# Patient Record
Sex: Male | Born: 1985 | Hispanic: No | Marital: Single | Smoking: Never smoker
Health system: Southern US, Community
[De-identification: ages and names within clinical notes are randomized; demographics above are authoritative.]

## PROBLEM LIST (undated history)

## (undated) DIAGNOSIS — N179 Acute kidney failure, unspecified: Secondary | ICD-10-CM

## (undated) HISTORY — PX: LEG SURGERY: SHX1003

---

## 2015-03-23 ENCOUNTER — Encounter (HOSPITAL_COMMUNITY): Payer: Self-pay | Admitting: Emergency Medicine

## 2015-03-23 ENCOUNTER — Emergency Department (HOSPITAL_COMMUNITY)
Admission: EM | Admit: 2015-03-23 | Discharge: 2015-03-23 | Disposition: A | Discharge status: Home / Self Care | Payer: Self-pay | Attending: Emergency Medicine | Admitting: Emergency Medicine

## 2015-03-23 DIAGNOSIS — Z87448 Personal history of other diseases of urinary system: Secondary | ICD-10-CM | POA: Insufficient documentation

## 2015-03-23 DIAGNOSIS — M5416 Radiculopathy, lumbar region: Secondary | ICD-10-CM | POA: Insufficient documentation

## 2015-03-23 HISTORY — DX: Acute kidney failure, unspecified: N17.9

## 2015-03-23 MED ORDER — METHOCARBAMOL 500 MG PO TABS
500.0000 mg | ORAL_TABLET | Freq: Once | ORAL | Status: AC
  Administered 2015-03-23: 500 mg via ORAL
  Filled 2015-03-23: qty 1

## 2015-03-23 MED ORDER — METHOCARBAMOL 500 MG PO TABS: 500.0000 mg | ORAL_TABLET | Freq: Three times a day (TID) | ORAL | Status: DC

## 2015-03-23 MED ORDER — KETOROLAC TROMETHAMINE 10 MG PO TABS
10.0000 mg | ORAL_TABLET | Freq: Once | ORAL | Status: AC
  Administered 2015-03-23: 10 mg via ORAL
  Filled 2015-03-23: qty 1

## 2015-03-23 MED ORDER — DICLOFENAC SODIUM 75 MG PO TBEC: 75.0000 mg | DELAYED_RELEASE_TABLET | Freq: Two times a day (BID) | ORAL | Status: DC

## 2015-03-23 NOTE — ED Provider Notes (Signed)
CSN: [id number]     Arrival date & time 03/23/15  1412 History  This chart was scribed for non-physician practitioner, Pauline Aus, PA-C, working with Vanetta Mulders, MD, by Budd Palmer ED Scribe. This patient was seen in room APFT23/APFT23 and the patient's care was started at 2:40 PM  Chief Complaint  Patient presents with  . Back Injury   Patient is a 29 y.o. male presenting with back pain. The history is provided by the patient. No language interpreter was used.  Back Pain Location:  Lumbar spine and sacro-iliac joint Quality:  Aching Radiates to:  R posterior upper leg, R thigh and R foot Pain severity:  Moderate Onset quality:  Gradual Duration:  6 days Timing:  Constant Progression:  Worsening Chronicity:  New Context: lifting heavy objects   Worsened by:  Sitting Ineffective treatments:  Ibuprofen Associated symptoms: leg pain   Associated symptoms: no abdominal pain, no bladder incontinence, no bowel incontinence, no chest pain, no dysuria, no fever, no numbness and no weakness    HPI Comments: Troy Edwards is a 29 y.o. male who presents to the Emergency Department complaining of gradually worsening right lower back pain onset 6 days ago. Patient states he worked out and performing squats 7 days ago and did not have onset of pain until the next day. He reports associated radiation of pain to right leg down to the sole of his right foot.  He reports increased pain when sitting, resolution of painf when standing or lying down supine. He states prior history of a similar injury, but says it was only an aggravation, without neurological damage. He denies associated abdominal pain, hip pain, and pain in the left leg. He has been taking ibuprofen since onset with moderate relief. He reports pain has worsened today, and his mother gave him a stronger pain pill of unknown medication, which helped to relieve the pain PTA. 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 Patient is not currently seen by a PCP.  Past Medical History  Diagnosis Date  . Acute renal failure    Past Surgical History  Procedure Laterality Date  . Leg surgery     History reviewed. No pertinent family history. History  Substance Use Topics  . Smoking status: Never Smoker   . Smokeless tobacco: Never Used  . Alcohol Use: Yes     Comment: occas   Review of Systems  Constitutional: Negative for fever, chills and fatigue.  Respiratory: Negative for cough and shortness of breath.   Cardiovascular: Negative for chest pain and palpitations.  Gastrointestinal: Negative for nausea, vomiting, abdominal pain and bowel incontinence.  Genitourinary: Negative for bladder incontinence, dysuria, hematuria and flank pain.  Musculoskeletal: Positive for back pain. Negative for arthralgias, gait problem, neck pain and neck stiffness.  Skin: Negative for rash.  Neurological: Negative for dizziness, weakness and numbness.  Hematological: Does not bruise/bleed easily.   Allergies  Review of patient's allergies indicates no known allergies.  Home Medications   Prior to Admission medications   Not on File   Triage Vitals: BP 138/67 mmHg  Pulse 109  Temp(Src) 98 F (36.7 C) (Oral)  Resp 18  Ht 5\' 7"  (1.702 m)  Wt 179 lb (81.194 kg)  BMI 28.03 kg/m2  SpO2 100%  Physical Exam  Constitutional: He is oriented to person, place, and time. He appears well-developed and well-nourished. No distress.  HENT:  Head: Normocephalic and atraumatic.  Eyes: Conjunctivae are normal.  Cardiovascular: Normal rate,  regular rhythm, normal heart sounds and intact distal pulses.   No murmur heard. Pulmonary/Chest: Effort normal and breath sounds normal. No respiratory distress. He has no wheezes.  Abdominal: Soft. Bowel sounds are normal. He exhibits no distension. There is no tenderness.  Musculoskeletal: Normal range of motion. He exhibits tenderness. He exhibits no edema.       Lumbar back:  He exhibits tenderness and pain. He exhibits normal range of motion, no swelling, no deformity, no laceration and normal pulse.  ttp of the right lumbar paraspinal muscles.  No spinal tenderness.  DP pulses are brisk and symmetrical.  Distal sensation intact.  Hip Flexors/Extensors are intact.  Pt has 5/5 strength against resistance of bilateral lower extremities.     Neurological: He is alert and oriented to person, place, and time. He has normal strength. No sensory deficit. He exhibits normal muscle tone. Coordination and gait normal.  Reflex Scores:      Patellar reflexes are 2+ on the right side and 2+ on the left side.      Achilles reflexes are 2+ on the right side and 2+ on the left side. Skin: Skin is warm and dry. No rash noted.  Psychiatric: He has a normal mood and affect.  Nursing note and vitals reviewed.  ED Course  Procedures (including critical care time) DIAGNOSTIC STUDIES: Oxygen Saturation is 100% on RA, normal by my interpretation.    COORDINATION OF CARE: 2:47 PM- Discussed plans to treat with muscle relaxant, and anti-inflammatory meds. Reccomended applying ice packs now, and heat after a few days. Will refer pt to see PCP. Pt advised of plan for treatment and pt agrees.  Labs Review Labs Reviewed - No data to display  Imaging Review No results found.   EKG Interpretation None      MDM   Final diagnoses:  Right lumbar radiculopathy   Pt is well appearing.  No focal neuro deficits, no concerning sx's for emergent neuro or infectious process.  Likely lumbar strain.  Pt agrees to sx tx and close f/u if needed.  Ambulates from the dept with a steady gait.  Rx for diclofenac and robaxin.     I personally performed the services described in this documentation, which was scribed in my presence. The recorded information has been reviewed and is accurate.   Pauline Aus, PA-C 03/24/15 1727  Vanetta Mulders, MD 03/26/15 2006

## 2015-03-23 NOTE — ED Notes (Signed)
Pt alert & oriented x4, stable gait. Patient given discharge instructions, paperwork & prescription(s). Patient  instructed to stop at the registration desk to finish any additional paperwork. Patient verbalized understanding. Pt left department w/ no further questions. 

## 2015-03-23 NOTE — ED Notes (Signed)
Pt reports numbness and tingling in his R leg and both hands. Onset of symptoms on Monday. Pt reports radiation of symptoms into his leg from his back. Pt denies problems with bowel or bladder.

## 2015-03-23 NOTE — Discharge Instructions (Signed)

## 2015-12-08 ENCOUNTER — Emergency Department (HOSPITAL_COMMUNITY)
Admission: EM | Admit: 2015-12-08 | Discharge: 2015-12-08 | Disposition: A | Discharge status: Home / Self Care | Payer: Self-pay | Attending: Emergency Medicine | Admitting: Emergency Medicine

## 2015-12-08 ENCOUNTER — Encounter (HOSPITAL_COMMUNITY): Payer: Self-pay | Admitting: *Deleted

## 2015-12-08 DIAGNOSIS — Z79899 Other long term (current) drug therapy: Secondary | ICD-10-CM | POA: Insufficient documentation

## 2015-12-08 DIAGNOSIS — Z791 Long term (current) use of non-steroidal anti-inflammatories (NSAID): Secondary | ICD-10-CM | POA: Insufficient documentation

## 2015-12-08 DIAGNOSIS — R21 Rash and other nonspecific skin eruption: Secondary | ICD-10-CM | POA: Insufficient documentation

## 2015-12-08 DIAGNOSIS — L739 Follicular disorder, unspecified: Secondary | ICD-10-CM | POA: Insufficient documentation

## 2015-12-08 LAB — URINE MICROSCOPIC-ADD ON: Squamous Epithelial / LPF: NONE SEEN

## 2015-12-08 LAB — URINALYSIS, ROUTINE W REFLEX MICROSCOPIC
Bilirubin Urine: NEGATIVE
Glucose, UA: NEGATIVE mg/dL
Ketones, ur: NEGATIVE mg/dL
Leukocytes, UA: NEGATIVE
Nitrite: NEGATIVE
PH: 5.5 (ref 5.0–8.0)
Protein, ur: 100 mg/dL — AB
SPECIFIC GRAVITY, URINE: 1.01 (ref 1.005–1.030)

## 2015-12-08 MED ORDER — DOXYCYCLINE HYCLATE 100 MG PO TABS
100.0000 mg | ORAL_TABLET | Freq: Once | ORAL | Status: AC
  Administered 2015-12-08: 100 mg via ORAL
  Filled 2015-12-08: qty 1

## 2015-12-08 MED ORDER — DOXYCYCLINE HYCLATE 100 MG PO CAPS: 100.0000 mg | ORAL_CAPSULE | Freq: Two times a day (BID) | ORAL | Status: DC

## 2015-12-08 NOTE — ED Provider Notes (Signed)
CSN: [id number]     Arrival date & time 12/08/15  1853 History   First MD Initiated Contact with Patient 12/08/15 2054     No chief complaint on file.    (Consider location/radiation/quality/duration/timing/severity/associated sxs/prior Treatment) HPI Comments: Patient is a 30 year old male who presents to the emergency department for a sexual transmitted disease check.  The patient states that he noticed some bumps and blisters in the pubis area. He says he popped several of the blisters, now he has areas with scabs, and some areas would blisters. He states that one of his friends who is in nursing training looked at the area and thought that he may have herpes. The patient states he has not knowingly been with anyone who had a sexually transmitted disease. He has not had any drainage from the penis. He's not had any fever or chills. He states there is also a rash on his buttocks area. He is not taken anything for these. They have not been evaluated before tonight.  The history is provided by the patient.    Past Medical History  Diagnosis Date  . Acute renal failure [facility](HCC)    Past Surgical History  Procedure Laterality Date  . Leg surgery     History reviewed. No pertinent family history. Social History  Substance Use Topics  . Smoking status: Never Smoker   . Smokeless tobacco: Never Used  . Alcohol Use: Yes     Comment: occas    Review of Systems  Constitutional: Negative for fever and chills.  Genitourinary: Negative for dysuria, urgency, discharge, penile swelling, scrotal swelling, penile pain and testicular pain.  Skin: Positive for rash.  All other systems reviewed and are negative.     Allergies  Review of patient's allergies indicates no known allergies.  Home Medications   Prior to Admission medications   Medication Sig Start Date End Date Taking? Authorizing Provider  diclofenac (VOLTAREN) 75 MG EC tablet Take 1 tablet (75 mg total) by mouth 2 (two) times  daily. Take with food 03/23/15   Tammy Triplett, PA-C  methocarbamol (ROBAXIN) 500 MG tablet Take 1 tablet (500 mg total) by mouth 3 (three) times daily. 03/23/15   Tammy Triplett, PA-C   BP 147/81 mmHg  Pulse 90  Temp(Src) 98.3 F (36.8 C) (Temporal)  Resp 18  Ht 5' 7.5" (1.715 m)  Wt 81.647 kg  BMI 27.76 kg/m2  SpO2 100% Physical Exam  Constitutional: He is oriented to person, place, and time. He appears well-developed and well-nourished.  Non-toxic appearance.  HENT:  Head: Normocephalic.  Right Ear: Tympanic membrane and external ear normal.  Left Ear: Tympanic membrane and external ear normal.  Eyes: EOM and lids are normal. Pupils are equal, round, and reactive to light.  Neck: Normal range of motion. Neck supple. Carotid bruit is not present.  Cardiovascular: Normal rate, regular rhythm, normal heart sounds, intact distal pulses and normal pulses.   Pulmonary/Chest: Breath sounds normal. No respiratory distress.  Abdominal: Soft. Bowel sounds are normal. There is no tenderness. There is no guarding.  Genitourinary:  The patient has been shaving the pubis. There are multiple scabbed areas at the base of the penis extending into the pubis area. There no lesions on the scrotum. There no lesions on the penis. There is no penile discharge noted. There is no tenderness of the testicles. There are palpable nodes in the inguinal area. There are a few blisters in between the scabbed areas.  There is a area of increased  redness with very small blisters noted on the right mid buttocks cheek. The anus is spared.  Musculoskeletal: Normal range of motion.  Lymphadenopathy:       Head (right side): No submandibular adenopathy present.       Head (left side): No submandibular adenopathy present.    He has no cervical adenopathy.  Neurological: He is alert and oriented to person, place, and time. He has normal strength. No cranial nerve deficit or sensory deficit.  Skin: Skin is warm and dry.   Psychiatric: He has a normal mood and affect. His speech is normal.  Nursing note and vitals reviewed.   ED Course  Procedures (including critical care time) Labs Review Labs Reviewed - No data to display  Imaging Review No results found. I have personally reviewed and evaluated these images and lab results as part of my medical decision-making.   EKG Interpretation None      MDM  The the scabbed areas on the pubis as well as the blister areas could possibly be related to a folliculitis. A herpes simplex 2 test has been sent to the lab. GC chlamydia testing has also been sent to the lab. The patient will be treated with doxycycline and triple antibiotic ointment. I discussed these findings, and the possibilities of diagnosis with the patient in terms which he understands and he is in agreement with this discharge plan.    Final diagnoses:  None    *I have reviewed nursing notes, vital signs, and all appropriate lab and imaging results for this patient.**    Ivery Quale, PA-C 12/08/15 2159  Dione Booze, MD 12/08/15 [phone]

## 2015-12-08 NOTE — Discharge Instructions (Signed)
A herpes test, as well as a gonorrhea Chlamydia tests have been sent to the lab. There is a good chance that these lesions may be what is called folliculitis, related to shaving in this area. Please use doxycycline 2 times daily, and applied triple antibiotic ointment to the area on the pubis, as well as the area on the buttocks until you're tests return. Please refrain from sexual activity until your test return. Folliculitis Folliculitis is redness, soreness, and swelling (inflammation) of the hair follicles. This condition can occur anywhere on the body. People with weakened immune systems, diabetes, or obesity have a greater risk of getting folliculitis. CAUSES  Bacterial infection. This is the most common cause.  Fungal infection.  Viral infection.  Contact with certain chemicals, especially oils and tars. Long-term folliculitis can result from bacteria that live in the nostrils. The bacteria may trigger multiple outbreaks of folliculitis over time. SYMPTOMS Folliculitis most commonly occurs on the scalp, thighs, legs, back, buttocks, and areas where hair is shaved frequently. An early sign of folliculitis is a small, white or yellow, pus-filled, itchy lesion (pustule). These lesions appear on a red, inflamed follicle. They are usually less than 0.2 inches (5 mm) wide. When there is an infection of the follicle that goes deeper, it becomes a boil or furuncle. A group of closely packed boils creates a larger lesion (carbuncle). Carbuncles tend to occur in hairy, sweaty areas of the body. DIAGNOSIS  Your caregiver can usually tell what is wrong by doing a physical exam. A sample may be taken from one of the lesions and tested in a lab. This can help determine what is causing your folliculitis. TREATMENT  Treatment may include:  Applying warm compresses to the affected areas.  Taking antibiotic medicines orally or applying them to the skin.  Draining the lesions if they contain a large  amount of pus or fluid.  Laser hair removal for cases of long-lasting folliculitis. This helps to prevent regrowth of the hair. HOME CARE INSTRUCTIONS  Apply warm compresses to the affected areas as directed by your caregiver.  If antibiotics are prescribed, take them as directed. Finish them even if you start to feel better.  You may take over-the-counter medicines to relieve itching.  Do not shave irritated skin.  Follow up with your caregiver as directed. SEEK IMMEDIATE MEDICAL CARE IF:   You have increasing redness, swelling, or pain in the affected area.  You have a fever. MAKE SURE YOU:  Understand these instructions.  Will watch your condition.  Will get help right away if you are not doing well or get worse.   This information is not intended to replace advice given to you by your health care provider. Make sure you discuss any questions you have with your health care provider.   Document Released: 11/29/2001 Document Revised: 10/11/2014 Document Reviewed: 12/21/2011 Elsevier Interactive Patient Education [employer]2016 Elsevier Inc.

## 2015-12-08 NOTE — ED Notes (Addendum)
Pt requesting STD check.  Pt believes he may have herpes outbreak.

## 2015-12-09 ENCOUNTER — Telehealth (HOSPITAL_BASED_OUTPATIENT_CLINIC_OR_DEPARTMENT_OTHER): Payer: Self-pay | Admitting: Emergency Medicine

## 2015-12-10 ENCOUNTER — Telehealth (HOSPITAL_BASED_OUTPATIENT_CLINIC_OR_DEPARTMENT_OTHER): Payer: Self-pay | Admitting: Emergency Medicine

## 2015-12-10 LAB — GC/CHLAMYDIA PROBE AMP (~~LOC~~) NOT AT ARMC
CHLAMYDIA, DNA PROBE: NEGATIVE
Neisseria Gonorrhea: NEGATIVE

## 2015-12-10 LAB — HSV 2 ANTIBODY, IGG: HSV 2 GLYCOPROTEIN G AB, IGG: 9.25 {index} — AB (ref 0.00–0.90)

## 2015-12-11 ENCOUNTER — Telehealth (HOSPITAL_BASED_OUTPATIENT_CLINIC_OR_DEPARTMENT_OTHER): Payer: Self-pay | Admitting: Emergency Medicine

## 2015-12-11 NOTE — Telephone Encounter (Signed)
Post ED Visit - Positive Culture Follow-up: Successful Patient Follow-Up  Culture assessed and recommendations reviewed by: []  Enzo BiNathan Batchelder, Pharm.D. []  Celedonio MiyamotoJeremy Frens, Pharm.D., BCPS []  Garvin FilaMike Maccia, Pharm.D. []  Georgina PillionElizabeth Martin, Pharm.D., BCPS []  CreedmoorMinh Pham, [address] Rainbow BoulevardPharm.D., BCPS, AAHIVP []  Estella HuskMichelle Turner, Pharm.D., BCPS, AAHIVP []  Tennis Mustassie Stewart, Pharm.D. []  Sherle Poeob Vincent, [place]D.  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

## 2016-03-18 ENCOUNTER — Emergency Department (HOSPITAL_COMMUNITY)
Admission: EM | Admit: 2016-03-18 | Discharge: 2016-03-18 | Disposition: A | Discharge status: Home / Self Care | Payer: Self-pay | Attending: Emergency Medicine | Admitting: Emergency Medicine

## 2016-03-18 ENCOUNTER — Encounter (HOSPITAL_COMMUNITY): Payer: Self-pay | Admitting: *Deleted

## 2016-03-18 DIAGNOSIS — J029 Acute pharyngitis, unspecified: Secondary | ICD-10-CM | POA: Insufficient documentation

## 2016-03-18 LAB — RAPID STREP SCREEN (MED CTR MEBANE ONLY): STREPTOCOCCUS, GROUP A SCREEN (DIRECT): NEGATIVE

## 2016-03-18 NOTE — Discharge Instructions (Signed)
You may alternate between Tylenol 1000 mg every 6 hours as needed for fever and pain and ibuprofen 800 mg every 8 hours as needed for fever and pain. Both of these medications are found over-the-counter. I recommend you gargle with warm saltwater as this may help with your pain. You may also use over-the-counter Chloraseptic spray. Your strep test was negative. At this time you do not need antibiotics. This is likely caused by a virus.   Pharyngitis Pharyngitis is redness, pain, and swelling (inflammation) of your pharynx.  CAUSES  Pharyngitis is usually caused by infection. Most of the time, these infections are from viruses (viral) and are part of a cold. However, sometimes pharyngitis is caused by bacteria (bacterial). Pharyngitis can also be caused by allergies. Viral pharyngitis may be spread from person to person by coughing, sneezing, and personal items or utensils (cups, forks, spoons, toothbrushes). Bacterial pharyngitis may be spread from person to person by more intimate contact, such as kissing.  SIGNS AND SYMPTOMS  Symptoms of pharyngitis include:   Sore throat.   Tiredness (fatigue).   Low-grade fever.   Headache.  Joint pain and muscle aches.  Skin rashes.  Swollen lymph nodes.  Plaque-like film on throat or tonsils (often seen with bacterial pharyngitis). DIAGNOSIS  Your health care provider will ask you questions about your illness and your symptoms. Your medical history, along with a physical exam, is often all that is needed to diagnose pharyngitis. Sometimes, a rapid strep test is done. Other lab tests may also be done, depending on the suspected cause.  TREATMENT  Viral pharyngitis will usually get better in 3-4 days without the use of medicine. Bacterial pharyngitis is treated with medicines that kill germs (antibiotics).  HOME CARE INSTRUCTIONS   Drink enough water and fluids to keep your urine clear or pale yellow.   Only take over-the-counter or  prescription medicines as directed by your health care provider:   If you are prescribed antibiotics, make sure you finish them even if you start to feel better.   Do not take aspirin.   Get lots of rest.   Gargle with 8 oz of salt water ( tsp of salt per 1 qt of water) as often as every 1-2 hours to soothe your throat.   Throat lozenges (if you are not at risk for choking) or sprays may be used to soothe your throat. SEEK MEDICAL CARE IF:   You have large, tender lumps in your neck.  You have a rash.  You cough up green, yellow-brown, or bloody spit. SEEK IMMEDIATE MEDICAL CARE IF:   Your neck becomes stiff.  You drool or are unable to swallow liquids.  You vomit or are unable to keep medicines or liquids down.  You have severe pain that does not go away with the use of recommended medicines.  You have trouble breathing (not caused by a stuffy nose). MAKE SURE YOU:   Understand these instructions.  Will watch your condition.  Will get help right away if you are not doing well or get worse.   This information is not intended to replace advice given to you by your health care provider. Make sure you discuss any questions you have with your health care provider.   Document Released: 09/20/2005 Document Revised: 07/11/2013 Document Reviewed: 05/28/2013 Elsevier Interactive Patient Education [employer] Elsevier Inc.

## 2016-03-18 NOTE — ED Notes (Signed)
Pt c/o sore throat x 3 days; pt states he sees white spots on the back of his throat

## 2016-03-18 NOTE — ED Provider Notes (Signed)
TIME SEEN: 6:10 AM  CHIEF COMPLAINT: Sore throat  HPI: Pt is a 30 y.o. male with no significant past medical history who presents to the emergency department with 3 days of sore throat. Worse on the right side. No fever, cough, vomiting, diarrhea, rash. No sick contacts. He is able to swallow, speak without difficulty. Took ibuprofen prior to arrival.  ROS: See HPI Constitutional: no fever  Eyes: no drainage  ENT: no runny nose   Cardiovascular:  no chest pain  Resp: no SOB  GI: no vomiting GU: no dysuria Integumentary: no rash  Allergy: no hives  Musculoskeletal: no leg swelling  Neurological: no slurred speech ROS otherwise negative  PAST MEDICAL HISTORY/PAST SURGICAL HISTORY:  Past Medical History  Diagnosis Date  . Acute renal failure (HCC)     MEDICATIONS:  Prior to Admission medications   Medication Sig Start Date End Date Taking? Authorizing Provider  doxycycline (VIBRAMYCIN) 100 MG capsule Take 1 capsule (100 mg total) by mouth 2 (two) times daily. 12/08/15   Ivery Quale, PA-C    ALLERGIES:  No Known Allergies  SOCIAL HISTORY:  Social History  Substance Use Topics  . Smoking status: Never Smoker   . Smokeless tobacco: Never Used  . Alcohol Use: Yes     Comment: occas    FAMILY HISTORY: No family history on file.  EXAM: BP 146/88 mmHg  Pulse 85  Temp(Src) 97.8 F (36.6 C) (Oral)  Resp 16  Ht  (1.727 m)  Wt 179 lb (81.194 kg)  BMI 27.22 kg/m2  SpO2 100% CONSTITUTIONAL: Alert and oriented and responds appropriately to questions. Well-appearing; well-nourished HEAD: Normocephalic EYES: Conjunctivae clear, PERRL ENT: normal nose; no rhinorrhea; moist mucous membranes; patient does have posterior pharyngeal erythema without petechiae, very mild bilateral tonsillar hypertrophy with exudate seen minimally on both tonsils. No uvular deviation, no obvious unilateral swelling noted, no trismus or drooling, normal phonation, no stridor, no dental caries or  abscess noted, no Ludwig's angina, tongue sits flat in the bottom of the mouth NECK: Supple, no meningismus, no LAD  CARD: RRR; S1 and S2 appreciated; no murmurs, no clicks, no rubs, no gallops RESP: Normal chest excursion without splinting or tachypnea; breath sounds clear and equal bilaterally; no wheezes, no rhonchi, no rales, no hypoxia or respiratory distress, speaking full sentences ABD/GI: Normal bowel sounds; non-distended; soft, non-tender, no rebound, no guarding, no peritoneal signs BACK:  The back appears normal and is non-tender to palpation, there is no CVA tenderness EXT: Normal ROM in all joints; non-tender to palpation; no edema; normal capillary refill; no cyanosis, no calf tenderness or swelling    SKIN: Normal color for age and race; warm; no rash NEURO: Moves all extremities equally, sensation to light touch intact diffusely, cranial nerves II through XII intact PSYCH: The patient's mood and manner are appropriate. Grooming and personal hygiene are appropriate.  MEDICAL DECISION MAKING: Patient here with what appears to be viral pharyngitis. Strep test is negative. I recommend alternating Tylenol and Motrin, warm saltwater gargles. I do not feel he needs to be on antibiotics. No sign of peritonsillar abscess, deep space neck infection, meningitis. He is very well-appearing, nontoxic, afebrile. Normal phonation, swallowing his secretions without difficulty and full range of motion in his neck. I feel he is safe for discharge. Discussed return precautions. He verbalized understanding and is comfortable with this plan.  At this time, I do not feel there is any life-threatening condition present. I have reviewed and discussed all results (  EKG, imaging, lab, urine as appropriate), exam findings with patient. I have reviewed nursing notes and appropriate previous records.  I feel the patient is safe to be discharged home without further emergent workup. Discussed usual and customary  return precautions. Patient and family (if present) verbalize understanding and are comfortable with this plan.  Patient will follow-up with their primary care provider. If they do not have a primary care provider, information for follow-up has been provided to them. All questions have been answered.      Layla MawKristen N Ward, DO 03/18/16 [phone]

## 2016-03-19 LAB — CULTURE, GROUP A STREP (THRC)

## 2016-05-25 ENCOUNTER — Emergency Department (HOSPITAL_COMMUNITY)
Admission: EM | Admit: 2016-05-25 | Discharge: 2016-05-25 | Disposition: A | Discharge status: Home / Self Care | Payer: Self-pay | Attending: Emergency Medicine | Admitting: Emergency Medicine

## 2016-05-25 ENCOUNTER — Emergency Department (HOSPITAL_COMMUNITY): Payer: Self-pay

## 2016-05-25 ENCOUNTER — Encounter (HOSPITAL_COMMUNITY): Payer: Self-pay

## 2016-05-25 DIAGNOSIS — R52 Pain, unspecified: Secondary | ICD-10-CM

## 2016-05-25 DIAGNOSIS — Z79899 Other long term (current) drug therapy: Secondary | ICD-10-CM | POA: Insufficient documentation

## 2016-05-25 DIAGNOSIS — N451 Epididymitis: Secondary | ICD-10-CM | POA: Insufficient documentation

## 2016-05-25 DIAGNOSIS — N4400 Torsion of testis, unspecified: Secondary | ICD-10-CM | POA: Insufficient documentation

## 2016-05-25 LAB — URINALYSIS, ROUTINE W REFLEX MICROSCOPIC
BILIRUBIN URINE: NEGATIVE
Glucose, UA: NEGATIVE mg/dL
Ketones, ur: NEGATIVE mg/dL
LEUKOCYTES UA: NEGATIVE
NITRITE: NEGATIVE
Protein, ur: >300 mg/dL — AB
SPECIFIC GRAVITY, URINE: 1.02 (ref 1.005–1.030)
pH: 7 (ref 5.0–8.0)

## 2016-05-25 LAB — URINE MICROSCOPIC-ADD ON

## 2016-05-25 MED ORDER — DOXYCYCLINE HYCLATE 100 MG PO TABS
100.0000 mg | ORAL_TABLET | Freq: Once | ORAL | Status: AC
  Administered 2016-05-25: 100 mg via ORAL
  Filled 2016-05-25: qty 1

## 2016-05-25 MED ORDER — LIDOCAINE HCL (PF) 1 % IJ SOLN
INTRAMUSCULAR | Status: AC
  Administered 2016-05-25: 5 mL
  Filled 2016-05-25: qty 5

## 2016-05-25 MED ORDER — CEFTRIAXONE SODIUM 250 MG IJ SOLR
250.0000 mg | Freq: Once | INTRAMUSCULAR | Status: AC
  Administered 2016-05-25: 250 mg via INTRAMUSCULAR
  Filled 2016-05-25: qty 250

## 2016-05-25 MED ORDER — DOXYCYCLINE HYCLATE 100 MG PO CAPS: 100.0000 mg | ORAL_CAPSULE | Freq: Two times a day (BID) | ORAL | 0 refills | Status: AC

## 2016-05-25 NOTE — ED Triage Notes (Signed)
Having testicular pain that started yesterday.  Right testicle feels like there is a lump and it is swollen.  Hurting up into lower pelvic area.  I have had testicular tension before, but it hurt a lot worse.  No discoloration noted per pt.  Uncle had testicular cancer before and that worries me.

## 2016-05-25 NOTE — ED Provider Notes (Signed)
Emergency Department Provider Note   I have reviewed the triage vital signs and the nursing notes.   HISTORY  Chief Complaint Testicle Pain   HPI Troy Edwards is a 30 y.o. 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 Patient states that he noticed some swelling 2 days ago and developed persistent pain yesterday. Pain is continued throughout the day today and so he presented to the emergency department. He has had a history of testicular torsion and states it was manually corrected and he was discharged home. Patient states he was not seen by urologist and had no additional evaluation or procedure after the torsion.   The patient reports some intermittent dysuria that is chronic in nature. He is currently having an HSV outbreak states it is resolving somewhat he does note some painful lymph nodes in his groin. No urethral discharge. No fevers. No trauma to the area. No lower abdominal pain.    Past Medical History:  Diagnosis Date  . Acute renal failure (HCC)     There are no active problems to display for this patient.   Past Surgical History:  Procedure Laterality Date  . LEG SURGERY      Current Outpatient Rx  . Order #: [id number] Class: Historical Med  . Order #: [id number] Class: Print    Allergies Review of patient's allergies indicates no known allergies.  No family history on file.  Social History Social History  Substance Use Topics  . Smoking status: Never Smoker  . Smokeless tobacco: Never Used  . Alcohol use Yes     Comment: occas    Review of Systems  Constitutional: No fever/chills Eyes: No visual changes. ENT: No sore throat. Cardiovascular: Denies chest pain. Respiratory: Denies shortness of breath. Gastrointestinal: No abdominal pain.  No nausea, no vomiting.  No diarrhea.  No constipation. Genitourinary: Chronic  dysuria and right testicle pain and swelling.  Musculoskeletal: Negative for back pain. Skin: Negative for rash. Neurological: Negative for headaches, focal weakness or numbness.  10-point ROS otherwise negative.  ____________________________________________   PHYSICAL EXAM:  VITAL SIGNS: ED Triage Vitals  Enc Vitals Group     BP 05/25/16 1903 145/95     Pulse Rate 05/25/16 1903 101     Resp 05/25/16 1903 18     Temp 05/25/16 1903 98.1 F (36.7 C)     Temp Source 05/25/16 1903 Oral     SpO2 05/25/16 1903 99 %     Weight 05/25/16 1904 178 lb (80.7 kg)     Height 05/25/16 1904 5\' 8"  (1.727 m)     Pain Score 05/25/16 1904 5   Constitutional: Alert and oriented. Well appearing and in no acute distress. Eyes: Conjunctivae are normal.  Head: Atraumatic. Nose: No congestion/rhinnorhea. Mouth/Throat: Mucous membranes are moist.  Oropharynx non-erythematous. Neck: No stridor.   Cardiovascular: Normal rate, regular rhythm. Good peripheral circulation. Grossly normal heart sounds.   Respiratory: Normal respiratory effort.  No retractions. Lungs CTAB. Gastrointestinal: Soft and nontender. No distention.  Genitourinary: Right testicle tenderness to palpation posteriorly with lower pole mass.  Musculoskeletal: No lower extremity tenderness nor edema. No gross deformities of extremities. Neurologic:  Normal speech and language. No gross focal neurologic deficits are appreciated.  Skin:  Skin is warm, dry and intact. No rash noted. Psychiatric: Mood and affect are normal. Speech and behavior are normal.  ____________________________________________   LABS (  all labs ordered are listed, but only abnormal results are displayed)  Labs Reviewed  URINALYSIS, ROUTINE W REFLEX MICROSCOPIC (NOT AT [facility]) - Abnormal; Notable for the following:       Result Value   Hgb urine dipstick MODERATE (*)    Protein, ur >300 (*)    All other components within normal limits  URINE MICROSCOPIC-ADD ON -  Abnormal; Notable for the following:    Squamous Epithelial / LPF 0-5 (*)    Bacteria, UA FEW (*)    All other components within normal limits   ____________________________________________  RADIOLOGY  [place] Scrotum  Result Date: 05/25/2016 CLINICAL DATA:  Right testicular pain and swelling for 1 day. EXAM: SCROTAL ULTRASOUND DOPPLER ULTRASOUND OF THE TESTICLES TECHNIQUE: Complete ultrasound examination of the testicles, epididymis, and other scrotal structures was performed. Color and spectral Doppler ultrasound were also utilized to evaluate blood flow to the testicles. COMPARISON:  None. FINDINGS: Right testicle Measurements: 4.5 x 2.2 x 2.7 cm. No mass or microlithiasis visualized. Normal blood flow. Left testicle Measurements: 4.3 x 2.3 x 2.9 cm. No mass or microlithiasis visualized. Normal blood flow. Right epididymis: Prominent size and heterogeneous. No definite increased blood flow. Left epididymis:  Normal in size and appearance. Hydrocele:  Small left hydrocele.  No right hydrocele. Varicocele:  None visualized. Pulsed Doppler interrogation of both testes demonstrates normal low resistance arterial and venous waveforms bilaterally. Other:  Normal size and morphology lymph nodes in the right groin. IMPRESSION: 1. Mild enlargement and heterogeneity of the right epididymis may reflect epididymitis. 2. No evidence of concurrent orchitis. Normal blood flow to both testicles without torsion. Electronically Signed   By: Rubye OaksMelanie  Ehinger M.D.   On: 05/25/2016 21:16   [place] Art/ven Flow Abd Pelv Doppler  Result Date: 05/25/2016 CLINICAL DATA:  Right testicular pain and swelling for 1 day. EXAM: SCROTAL ULTRASOUND DOPPLER ULTRASOUND OF THE TESTICLES TECHNIQUE: Complete ultrasound examination of the testicles, epididymis, and other scrotal structures was performed. Color and spectral Doppler ultrasound were also utilized to evaluate blood flow to the testicles. COMPARISON:  None. FINDINGS: Right testicle  Measurements: 4.5 x 2.2 x 2.7 cm. No mass or microlithiasis visualized. Normal blood flow. Left testicle Measurements: 4.3 x 2.3 x 2.9 cm. No mass or microlithiasis visualized. Normal blood flow. Right epididymis: Prominent size and heterogeneous. No definite increased blood flow. Left epididymis:  Normal in size and appearance. Hydrocele:  Small left hydrocele.  No right hydrocele. Varicocele:  None visualized. Pulsed Doppler interrogation of both testes demonstrates normal low resistance arterial and venous waveforms bilaterally. Other:  Normal size and morphology lymph nodes in the right groin. IMPRESSION: 1. Mild enlargement and heterogeneity of the right epididymis may reflect epididymitis. 2. No evidence of concurrent orchitis. Normal blood flow to both testicles without torsion. Electronically Signed   By: Rubye OaksMelanie  Ehinger M.D.   On: 05/25/2016 21:16    ____________________________________________   PROCEDURES  Procedure(s) performed:   Procedures  None ____________________________________________   INITIAL IMPRESSION / ASSESSMENT AND PLAN / ED COURSE  Pertinent labs & imaging results that were available during my care of the patient were reviewed by me and considered in my medical decision making (see chart for details).  Patient resents to the emergency department for evaluation of right testicular pain and area of swelling. The patient has a palpable mass the posterior pole of his right testicle that is tender to palpation. Unclear if this represents hydrocele, epididymitis, torsion, or torsion of testicle appendix. No scrotal discoloration.  No fever. Normal abdominal exam. Given that the patient was never surgically managed for his testicular torsion I still have increased level of suspicion for this diagnosis and feel he must be evaluated completely ruled out. Plan for urgent testicular ultrasound to assess flow. Have called an ultrasound technician for evaluation. Appreciate  assistance.   09:28 PM No evidence of torsion on US. Following UA.   UA resulted with few bacteria. Patient with prior protein in urine before. Plan for treatment of epididymitis. Discussed that with his age and history this is most commonly the result of an STI. He will contact any sexual partners to advise testing. Discussed return precautions in detail. He will follow up with his PCP and Urology as needed by referral from his PCP. Discussed warning signs of testicular torsion. Given that the patient never had surgery for his torsion he remains at risk.   At this time, I do not feel there is any life-threatening condition present. I have reviewed and discussed all results (EKG, imaging, lab, urine as appropriate), exam findings with patient. I have reviewed nursing notes and appropriate previous records.  I feel the patient is safe to be discharged home without further emergent workup. Discussed usual and customary return precautions. Patient and family (if present) verbalize understanding and are comfortable with this plan.  Patient will follow-up with their primary care provider. If they do not have a primary care provider, information for follow-up has been provided to them. All questions have been answered.  ____________________________________________  FINAL CLINICAL IMPRESSION(S) / ED DIAGNOSES  Final diagnoses:  Torsion of testicle  Epididymitis    MEDICATIONS GIVEN DURING THIS VISIT:  Medications  cefTRIAXone (ROCEPHIN) injection 250 mg (250 mg Intramuscular Given 05/25/16 2331)  doxycycline (VIBRA-TABS) tablet 100 mg (100 mg Oral Given 05/25/16 2332)  lidocaine (PF) (XYLOCAINE) 1 % injection (5 mLs  Given 05/25/16 2331)    NEW OUTPATIENT MEDICATIONS STARTED DURING THIS VISIT:  New Prescriptions   DOXYCYCLINE (VIBRAMYCIN) 100 MG CAPSULE    Take 1 capsule (100 mg total) by mouth 2 (two) times daily.      Note:  This document was prepared using Dragon voice recognition software  and may include unintentional dictation errors.  Alona BeneJoshua Long, MD Emergency Medicine   Maia PlanJoshua G Long, MD 05/25/16 [phone]

## 2016-05-25 NOTE — Discharge Instructions (Signed)
You were seen in the ED today with testicle pain. We performed an ultrasound with no evidence of testicular torsion. We are treating an infection with antibiotics. Have any sexual partners treated as well.   Return to the ED with any sudden worsening of testicle pain, swelling, or fever.

## 2016-05-25 NOTE — ED Notes (Signed)
Radiology notified of need for ultrasound to come in,

## 2016-05-25 NOTE — ED Notes (Signed)
Pt ambulatory to restroom, denied any needs. MD Long to see pt.

## 2016-05-25 NOTE — ED Triage Notes (Signed)
Patient states that his testicles have wrapped around each other before.

## 2016-05-27 ENCOUNTER — Emergency Department (HOSPITAL_COMMUNITY)
Admission: EM | Admit: 2016-05-27 | Discharge: 2016-05-27 | Disposition: A | Discharge status: Home / Self Care | Payer: Self-pay | Attending: Emergency Medicine | Admitting: Emergency Medicine

## 2016-05-27 ENCOUNTER — Encounter (HOSPITAL_COMMUNITY): Payer: Self-pay

## 2016-05-27 DIAGNOSIS — N453 Epididymo-orchitis: Secondary | ICD-10-CM | POA: Insufficient documentation

## 2016-05-27 MED ORDER — TRAMADOL HCL 50 MG PO TABS: 50.0000 mg | ORAL_TABLET | Freq: Four times a day (QID) | ORAL | 0 refills | Status: DC | PRN

## 2016-05-27 MED ORDER — LEVOFLOXACIN 500 MG PO TABS
500.0000 mg | ORAL_TABLET | Freq: Once | ORAL | Status: AC
  Administered 2016-05-27: 500 mg via ORAL
  Filled 2016-05-27: qty 1

## 2016-05-27 MED ORDER — LEVOFLOXACIN 500 MG PO TABS
500.0000 mg | ORAL_TABLET | Freq: Every day | ORAL | 0 refills | Status: DC
Start: 2016-05-27 — End: 2016-11-10

## 2016-05-27 NOTE — ED Triage Notes (Signed)
Right testcile pain/swelling since Monday. States he was seen on Tuesday and right testicle has "doubled in size". Denies urinary problems.

## 2016-05-27 NOTE — ED Provider Notes (Signed)
[facility]-EMERGENCY DEPT Provider Note   CSN: [id number] Arrival date & time: 05/27/16  1551     History   Chief Complaint Chief Complaint  Patient presents with  . Testicle Pain    HPI [name] is a 30 y.o. male.  Patient was seen couple days ago with epididymitis. He was given doxycycline but states it seems be getting worse   The history is provided by the patient. No language interpreter was used.  Testicle Pain  This is a recurrent problem. The current episode started more than 2 days ago. The problem occurs constantly. The problem has been gradually worsening. Pertinent negatives include no chest pain, no abdominal pain and no headaches. Nothing aggravates the symptoms. Nothing relieves the symptoms. Treatments tried: Antibiotic. The treatment provided no relief.    Past Medical History:  Diagnosis Date  . Acute renal failure (HCC)     There are no active problems to display for this patient.   Past Surgical History:  Procedure Laterality Date  . LEG SURGERY         Home Medications    Prior to Admission medications   Medication Sig Start Date End Date Taking? Authorizing Provider  doxycycline (VIBRAMYCIN) 100 MG capsule Take 1 capsule (100 mg total) by mouth 2 (two) times daily. 05/25/16 06/04/16  Maia PlanJoshua G Long, MD  L-Lysine 1000 MG TABS Take 1 tablet by mouth daily.    Historical Provider, MD  levofloxacin (LEVAQUIN) 500 MG tablet Take 1 tablet (500 mg total) by mouth daily. 05/27/16   Bethann BerkshireJoseph Von Inscoe, MD  traMADol (ULTRAM) 50 MG tablet Take 1 tablet (50 mg total) by mouth every 6 (six) hours as needed. 05/27/16   Bethann BerkshireJoseph Marely Apgar, MD    Family History No family history on file.  Social History Social History  Substance Use Topics  . Smoking status: Never Smoker  . Smokeless tobacco: Never Used  . Alcohol use Yes     Comment: occas     Allergies   Review of patient's allergies indicates no known allergies.   Review of Systems Review of Systems    Constitutional: Negative for appetite change and fatigue.  HENT: Negative for congestion, ear discharge and sinus pressure.   Eyes: Negative for discharge.  Respiratory: Negative for cough.   Cardiovascular: Negative for chest pain.  Gastrointestinal: Negative for abdominal pain and diarrhea.  Genitourinary: Positive for testicular pain. Negative for frequency and hematuria.  Musculoskeletal: Negative for back pain.  Skin: Negative for rash.  Neurological: Negative for seizures and headaches.  Psychiatric/Behavioral: Negative for hallucinations.     Physical Exam Updated Vital Signs BP 136/81   Pulse 88   Temp 98.5 F (36.9 C) (Oral)   Resp 16   Ht 5\' 8"  (1.727 m)   Wt 178 lb (80.7 kg)   SpO2 98%   BMI 27.06 kg/m   Physical Exam  Constitutional: He is oriented to person, place, and time. He appears well-developed.  HENT:  Head: Normocephalic.  Eyes: Conjunctivae and EOM are normal. No scleral icterus.  Neck: Neck supple. No thyromegaly present.  Cardiovascular: Normal rate and regular rhythm.  Exam reveals no gallop and no friction rub.   No murmur heard. Pulmonary/Chest: No stridor. He has no wheezes. He has no rales. He exhibits no tenderness.  Abdominal: He exhibits no distension. There is no tenderness. There is no rebound.  Genitourinary:  Genitourinary Comments: Swollen tender right testicle  Musculoskeletal: Normal range of motion. He exhibits no edema.  Lymphadenopathy:  He has no cervical adenopathy.  Neurological: He is oriented to person, place, and time. He exhibits normal muscle tone. Coordination normal.  Skin: No rash noted. No erythema.  Psychiatric: He has a normal mood and affect. His behavior is normal.     ED Treatments / Results  Labs (all labs ordered are listed, but only abnormal results are displayed) Labs Reviewed - No data to display  EKG  EKG Interpretation None       Radiology US Scrotum  Result Date: 05/25/2016 CLINICAL  DATA:  Right testicular pain and swelling for 1 day. EXAM: SCROTAL ULTRASOUND DOPPLER ULTRASOUND OF THE TESTICLES TECHNIQUE: Complete ultrasound examination of the testicles, epididymis, and other scrotal structures was performed. Color and spectral Doppler ultrasound were also utilized to evaluate blood flow to the testicles. COMPARISON:  None. FINDINGS: Right testicle Measurements: 4.5 x 2.2 x 2.7 cm. No mass or microlithiasis visualized. Normal blood flow. Left testicle Measurements: 4.3 x 2.3 x 2.9 cm. No mass or microlithiasis visualized. Normal blood flow. Right epididymis: Prominent size and heterogeneous. No definite increased blood flow. Left epididymis:  Normal in size and appearance. Hydrocele:  Small left hydrocele.  No right hydrocele. Varicocele:  None visualized. Pulsed Doppler interrogation of both testes demonstrates normal low resistance arterial and venous waveforms bilaterally. Other:  Normal size and morphology lymph nodes in the right groin. IMPRESSION: 1. Mild enlargement and heterogeneity of the right epididymis may reflect epididymitis. 2. No evidence of concurrent orchitis. Normal blood flow to both testicles without torsion. Electronically Signed   By: Rubye Oaks M.D.   On: 05/25/2016 21:16   [place] Art/ven Flow Abd Pelv Doppler  Result Date: 05/25/2016 CLINICAL DATA:  Right testicular pain and swelling for 1 day. EXAM: SCROTAL ULTRASOUND DOPPLER ULTRASOUND OF THE TESTICLES TECHNIQUE: Complete ultrasound examination of the testicles, epididymis, and other scrotal structures was performed. Color and spectral Doppler ultrasound were also utilized to evaluate blood flow to the testicles. COMPARISON:  None. FINDINGS: Right testicle Measurements: 4.5 x 2.2 x 2.7 cm. No mass or microlithiasis visualized. Normal blood flow. Left testicle Measurements: 4.3 x 2.3 x 2.9 cm. No mass or microlithiasis visualized. Normal blood flow. Right epididymis: Prominent size and heterogeneous. No definite  increased blood flow. Left epididymis:  Normal in size and appearance. Hydrocele:  Small left hydrocele.  No right hydrocele. Varicocele:  None visualized. Pulsed Doppler interrogation of both testes demonstrates normal low resistance arterial and venous waveforms bilaterally. Other:  Normal size and morphology lymph nodes in the right groin. IMPRESSION: 1. Mild enlargement and heterogeneity of the right epididymis may reflect epididymitis. 2. No evidence of concurrent orchitis. Normal blood flow to both testicles without torsion. Electronically Signed   By: Rubye Oaks M.D.   On: 05/25/2016 21:16    Procedures Procedures (including critical care time)  Medications Ordered in ED Medications  levofloxacin (LEVAQUIN) tablet 500 mg (not administered)     Initial Impression / Assessment and Plan / ED Course  I have reviewed the triage vital signs and the nursing notes.  Pertinent labs & imaging results that were available during my care of the patient were reviewed by me and considered in my medical decision making (see chart for details).  Clinical Course    Patient with scrotal pain diagnosis of epididymitis and orchitis a couple days ago. He had an ultrasound. Patient on doxycycline and says he is getting some worse. I will add Levaquin to his treatment plan and he will follow-up with urology  next week. Patient was instructed if he gets worse over the weekend to go to [facility]  Final Clinical Impressions(s) / ED Diagnoses   Final diagnoses:  Epididymoorchitis    New Prescriptions New Prescriptions   LEVOFLOXACIN (LEVAQUIN) 500 MG TABLET    Take 1 tablet (500 mg total) by mouth daily.   TRAMADOL (ULTRAM) 50 MG TABLET    Take 1 tablet (50 mg total) by mouth every 6 (six) hours as needed.     Bethann Berkshire, MD 05/27/16 [phone]

## 2016-05-27 NOTE — Discharge Instructions (Signed)
Call tomorrow to [facility]. [facility] [place] [place] next week for recheck. If you get significantly worse over the weekend then [facility] to be [facility]

## 2016-06-13 ENCOUNTER — Emergency Department (HOSPITAL_COMMUNITY): Payer: Self-pay

## 2016-06-13 ENCOUNTER — Emergency Department (HOSPITAL_COMMUNITY)
Admission: EM | Admit: 2016-06-13 | Discharge: 2016-06-14 | Disposition: A | Discharge status: Home / Self Care | Payer: Self-pay | Attending: Emergency Medicine | Admitting: Emergency Medicine

## 2016-06-13 ENCOUNTER — Encounter (HOSPITAL_COMMUNITY): Payer: Self-pay | Admitting: Nurse Practitioner

## 2016-06-13 DIAGNOSIS — Z79899 Other long term (current) drug therapy: Secondary | ICD-10-CM | POA: Insufficient documentation

## 2016-06-13 DIAGNOSIS — Z711 Person with feared health complaint in whom no diagnosis is made: Secondary | ICD-10-CM

## 2016-06-13 DIAGNOSIS — N5089 Other specified disorders of the male genital organs: Secondary | ICD-10-CM

## 2016-06-13 NOTE — ED Notes (Signed)
US at bedside

## 2016-06-13 NOTE — ED Triage Notes (Signed)
Pt states he has a painless immovabale testicular lump that he is concerned for malignancy, recently treated to epididymitis that he reports symptoms have cleared up.

## 2016-06-14 NOTE — ED Provider Notes (Signed)
[facility]-EMERGENCY DEPT Provider Note   CSN: [id number] Arrival date & time: 06/13/16  2047     History   Chief Complaint Chief Complaint  Patient presents with  . Testicular Lump    HPI Troy Edwards is a 30 y.o. male.  Patient has noted an enlargement of part of his testicle which is firm. He received ABx for epididymitis and his pain has been relieved but it continues to be enlarged which concerned him so he presents for evaluation. No fever, no urinary symptoms, no pain.   The history is provided by the patient.    Past Medical History:  Diagnosis Date  . Acute renal failure (HCC)     There are no active problems to display for this patient.   Past Surgical History:  Procedure Laterality Date  . LEG SURGERY         Home Medications    Prior to Admission medications   Medication Sig Start Date End Date Taking? Authorizing Provider  L-Lysine 1000 MG TABS Take 1 tablet by mouth daily.    Historical Provider, MD  levofloxacin (LEVAQUIN) 500 MG tablet Take 1 tablet (500 mg total) by mouth daily. 05/27/16   Bethann Berkshire, MD  traMADol (ULTRAM) 50 MG tablet Take 1 tablet (50 mg total) by mouth every 6 (six) hours as needed. 05/27/16   Bethann Berkshire, MD    Family History History reviewed. No pertinent family history.  Social History Social History  Substance Use Topics  . Smoking status: Never Smoker  . Smokeless tobacco: Never Used  . Alcohol use Yes     Comment: occas     Allergies   Review of patient's allergies indicates no known allergies.   Review of Systems Review of Systems  All other systems reviewed and are negative.    Physical Exam Updated Vital Signs BP 132/91 (BP Location: Right Arm)   Pulse 85   Temp 97.9 F (36.6 C) (Oral)   Resp 18   SpO2 100%   Physical Exam  Constitutional: He is oriented to person, place, and time. He appears well-developed and well-nourished. No distress.  HENT:  Head: Normocephalic and atraumatic.    Eyes: Conjunctivae are normal.  Neck: Neck supple. No tracheal deviation present.  Cardiovascular: Normal rate and regular rhythm.   Pulmonary/Chest: Effort normal. No respiratory distress.  Abdominal: Soft. He exhibits no distension.  Genitourinary: Right testis shows swelling (slight enlargement of epididymus). Right testis shows no tenderness. Left testis shows no swelling and no tenderness.  Neurological: He is alert and oriented to person, place, and time.  Skin: Skin is warm and dry.  Psychiatric: He has a normal mood and affect.     ED Treatments / Results  Labs (all labs ordered are listed, but only abnormal results are displayed) Labs Reviewed - No data to display  EKG  EKG Interpretation None       Radiology No results found.  Procedures Procedures (including critical care time)  Medications Ordered in ED Medications - No data to display   Initial Impression / Assessment and Plan / ED Course  I have reviewed the triage vital signs and the nursing notes.  Pertinent labs & imaging results that were available during my care of the patient were reviewed by me and considered in my medical decision making (see chart for details).  Clinical Course    30 y.o. male presents with right scrotal mass that he is concerned about since receiving treatment for epididymitis. epydidimus is  enlarged slightly but mobile and non-tender. This is likely a benign finding given multiple recent imaging studies without mass and no clinical s/s or persistent epididymitis. Pain free and concerned about mass, reassurance provided. Patient needs to establish primary care in the area and was provided contact information to do so.   Final Clinical Impressions(s) / ED Diagnoses   Final diagnoses:  Physically well but worried    New Prescriptions New Prescriptions   No medications on file     Lyndal Pulleyaniel Leasia Swann, MD 06/14/16 [phone]

## 2016-11-10 ENCOUNTER — Encounter (HOSPITAL_COMMUNITY): Payer: Self-pay | Admitting: Emergency Medicine

## 2016-11-10 ENCOUNTER — Emergency Department (HOSPITAL_COMMUNITY)
Admission: EM | Admit: 2016-11-10 | Discharge: 2016-11-10 | Disposition: A | Discharge status: Home / Self Care | Payer: BLUE CROSS/BLUE SHIELD | Attending: Emergency Medicine | Admitting: Emergency Medicine

## 2016-11-10 DIAGNOSIS — R799 Abnormal finding of blood chemistry, unspecified: Secondary | ICD-10-CM | POA: Diagnosis present

## 2016-11-10 DIAGNOSIS — R809 Proteinuria, unspecified: Secondary | ICD-10-CM

## 2016-11-10 DIAGNOSIS — R821 Myoglobinuria: Secondary | ICD-10-CM | POA: Insufficient documentation

## 2016-11-10 LAB — COMPREHENSIVE METABOLIC PANEL
ALBUMIN: 3.7 g/dL (ref 3.5–5.0)
ALT: 35 U/L (ref 17–63)
AST: 36 U/L (ref 15–41)
Alkaline Phosphatase: 46 U/L (ref 38–126)
Anion gap: 7 (ref 5–15)
BILIRUBIN TOTAL: 0.5 mg/dL (ref 0.3–1.2)
BUN: 8 mg/dL (ref 6–20)
CO2: 29 mmol/L (ref 22–32)
CREATININE: 1.26 mg/dL — AB (ref 0.61–1.24)
Calcium: 9.4 mg/dL (ref 8.9–10.3)
Chloride: 103 mmol/L (ref 101–111)
GFR calc Af Amer: >60 mL/min (ref >60)
GLUCOSE: 99 mg/dL (ref 65–99)
Potassium: 4 mmol/L (ref 3.5–5.1)
Sodium: 139 mmol/L (ref 135–145)
Total Protein: 6.5 g/dL (ref 6.5–8.1)

## 2016-11-10 LAB — URINALYSIS, ROUTINE W REFLEX MICROSCOPIC
BACTERIA UA: NONE SEEN
Bilirubin Urine: NEGATIVE
Glucose, UA: NEGATIVE mg/dL
KETONES UR: NEGATIVE mg/dL
LEUKOCYTES UA: NEGATIVE
Nitrite: NEGATIVE
PROTEIN: 100 mg/dL — AB
Specific Gravity, Urine: 1.006 (ref 1.005–1.030)
pH: 6 (ref 5.0–8.0)

## 2016-11-10 LAB — CBC WITH DIFFERENTIAL/PLATELET
BASOS ABS: 0.1 10*3/uL (ref 0.0–0.1)
BASOS PCT: 1 %
Eosinophils Absolute: 0.2 10*3/uL (ref 0.0–0.7)
Eosinophils Relative: 2 %
HEMATOCRIT: 49.6 % (ref 39.0–52.0)
Hemoglobin: 17 g/dL (ref 13.0–17.0)
LYMPHS PCT: 28 %
Lymphs Abs: 2.4 10*3/uL (ref 0.7–4.0)
MCH: 28.2 pg (ref 26.0–34.0)
MCHC: 34.3 g/dL (ref 30.0–36.0)
MCV: 82.4 fL (ref 78.0–100.0)
Monocytes Absolute: 0.6 10*3/uL (ref 0.1–1.0)
Monocytes Relative: 7 %
NEUTROS ABS: 5.4 10*3/uL (ref 1.7–7.7)
Neutrophils Relative %: 62 %
Platelets: 220 10*3/uL (ref 150–400)
RBC: 6.02 MIL/uL — ABNORMAL HIGH (ref 4.22–5.81)
RDW: 12.9 % (ref 11.5–15.5)
WBC: 8.7 10*3/uL (ref 4.0–10.5)

## 2016-11-10 LAB — CK: CK TOTAL: 422 U/L — AB (ref 49–397)

## 2016-11-10 MED ORDER — SODIUM CHLORIDE 0.9 % IV BOLUS (SEPSIS)
1000.0000 mL | Freq: Once | INTRAVENOUS | Status: AC
  Administered 2016-11-10: 1000 mL via INTRAVENOUS

## 2016-11-10 NOTE — ED Provider Notes (Signed)
[facility]-EMERGENCY DEPT Provider Note   CSN: [id number] Arrival date & time: 11/10/16  1513     History   Chief Complaint Chief Complaint  Patient presents with  . Abnormal Lab    HPI Troy Edwards is a 31 y.o. male.  Pt presents to the ED to check for rhabdomyolysis and kidney failure.  The pt said he went to the [employer] recruitment office; had urine done which showed abnormalities.   Pt is not exactly sure what they were, but was told to come to the ED to get his kidneys checked.  The pt does life weights regularly and does use supplements with creatine in them.  He also eats at least 2 grams protein/kg.  Pt said that he feels fine.      Past Medical History:  Diagnosis Date  . Acute renal failure (HCC)     There are no active problems to display for this patient.   Past Surgical History:  Procedure Laterality Date  . LEG SURGERY         Home Medications    Prior to Admission medications   Not on File    Family History History reviewed. No pertinent family history.  Social History Social History  Substance Use Topics  . Smoking status: Never Smoker  . Smokeless tobacco: Never Used  . Alcohol use Yes     Comment: occas     Allergies   Patient has no known allergies.   Review of Systems Review of Systems  All other systems reviewed and are negative.    Physical Exam Updated Vital Signs BP 137/88 (BP Location: Left Arm)   Pulse 85   Temp 97.9 F (36.6 C) (Oral)   Resp 18   Ht 5\' 8"  (1.727 m)   Wt 176 lb (79.8 kg)   SpO2 98%   BMI 26.76 kg/m   Physical Exam  Constitutional: He is oriented to person, place, and time. He appears well-developed and well-nourished.  HENT:  Head: Normocephalic and atraumatic.  Right Ear: External ear normal.  Left Ear: External ear normal.  Nose: Nose normal.  Mouth/Throat: Oropharynx is clear and moist.  Eyes: Conjunctivae and EOM are normal. Pupils are equal, round, and reactive to light.  Neck: Normal  range of motion. Neck supple.  Cardiovascular: Normal rate, regular rhythm, normal heart sounds and intact distal pulses.   Pulmonary/Chest: Effort normal and breath sounds normal.  Abdominal: Soft. Bowel sounds are normal.  Musculoskeletal: Normal range of motion.  Pt has very well developed musculature  Neurological: He is alert and oriented to person, place, and time.  Skin: Skin is warm.  Psychiatric: He has a normal mood and affect. His behavior is normal. Judgment and thought content normal.  Nursing note and vitals reviewed.    ED Treatments / Results  Labs (all labs ordered are listed, but only abnormal results are displayed) Labs Reviewed  URINALYSIS, ROUTINE W REFLEX MICROSCOPIC - Abnormal; Notable for the following:       Result Value   Color, Urine STRAW (*)    Hgb urine dipstick SMALL (*)    Protein, ur 100 (*)    All other components within normal limits  CBC WITH DIFFERENTIAL/PLATELET - Abnormal; Notable for the following:    RBC 6.02 (*)    All other components within normal limits  COMPREHENSIVE METABOLIC PANEL - Abnormal; Notable for the following:    Creatinine, Ser 1.26 (*)    All other components within normal limits  CK - Abnormal; Notable for the following:    Total CK 422 (*)    All other components within normal limits    EKG  EKG Interpretation None       Radiology No results found.  Procedures Procedures (including critical care time)  Medications Ordered in ED Medications  sodium chloride 0.9 % bolus 1,000 mL (1,000 mLs Intravenous New Bag/Given 11/10/16 1654)     Initial Impression / Assessment and Plan / ED Course  I have reviewed the triage vital signs and the nursing notes.  Pertinent labs & imaging results that were available during my care of the patient were reviewed by me and considered in my medical decision making (see chart for details).    I spoke with pt's [employer] recru[employer] per pt's request.  Pt is not permanently  disqualified.   Pt encouraged to stop taking any supplements.  He is told not to lift as much, decrease his protein intake, and to increase his fluid intake.  The pt is given the number of nephrology to f/u.    Final Clinical Impressions(s) / ED Diagnoses   Final diagnoses:  Myoglobinuria  Microalbuminuria    New Prescriptions New Prescriptions   No medications on file     Jacalyn LefevreJulie Serge Main, MD 11/10/16 1821

## 2016-11-10 NOTE — Discharge Instructions (Signed)
Decrease protein intake.  Increase fluid intake.  Do not take any supplements for muscle gain.  Decrease the amount of weight lifting that you do.

## 2016-11-10 NOTE — ED Triage Notes (Signed)
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 PT states no symptoms and states he has been working out and eating 150-170 grams daily.

## 2016-12-12 ENCOUNTER — Encounter (HOSPITAL_COMMUNITY): Payer: Self-pay | Admitting: Emergency Medicine

## 2016-12-12 ENCOUNTER — Emergency Department (HOSPITAL_COMMUNITY)
Admission: EM | Admit: 2016-12-12 | Discharge: 2016-12-13 | Disposition: A | Discharge status: Home / Self Care | Payer: BLUE CROSS/BLUE SHIELD | Attending: Emergency Medicine | Admitting: Emergency Medicine

## 2016-12-12 DIAGNOSIS — R801 Persistent proteinuria, unspecified: Secondary | ICD-10-CM | POA: Diagnosis not present

## 2016-12-12 DIAGNOSIS — R361 Hematospermia: Secondary | ICD-10-CM | POA: Diagnosis not present

## 2016-12-12 LAB — URINALYSIS, ROUTINE W REFLEX MICROSCOPIC
BACTERIA UA: NONE SEEN
BILIRUBIN URINE: NEGATIVE
Glucose, UA: NEGATIVE mg/dL
KETONES UR: NEGATIVE mg/dL
LEUKOCYTES UA: NEGATIVE
Nitrite: NEGATIVE
Protein, ur: >=300 mg/dL — AB
Specific Gravity, Urine: 1.013 (ref 1.005–1.030)
pH: 6 (ref 5.0–8.0)

## 2016-12-12 MED ORDER — DOXYCYCLINE HYCLATE 100 MG PO TABS
100.0000 mg | ORAL_TABLET | Freq: Once | ORAL | Status: AC
  Administered 2016-12-12: 100 mg via ORAL
  Filled 2016-12-12: qty 1

## 2016-12-12 MED ORDER — DOXYCYCLINE HYCLATE 100 MG PO CAPS: ORAL_CAPSULE | ORAL | 0 refills | Status: DC

## 2016-12-12 MED ORDER — LIDOCAINE HCL (PF) 1 % IJ SOLN
INTRAMUSCULAR | Status: AC
  Administered 2016-12-12: 2.2 mL
  Filled 2016-12-12: qty 5

## 2016-12-12 MED ORDER — CEFTRIAXONE SODIUM 250 MG IJ SOLR
250.0000 mg | Freq: Once | INTRAMUSCULAR | Status: AC
  Administered 2016-12-12: 250 mg via INTRAMUSCULAR
  Filled 2016-12-12: qty 250

## 2016-12-12 NOTE — ED Triage Notes (Signed)
Pt was having intercourse, afterwards noticed large amount of blood in semen collected in condom.  Pt denies any painful urination, no pain with ejaculation, no notable blood in urine.

## 2016-12-12 NOTE — ED Provider Notes (Signed)
[facility] Provider Note   CSN: [id number] Arrival date & time: 12/12/16  2216 By signing my name below, I, Levon Hedger, attest that this documentation has been prepared under the direction and in the presence of Zadie Rhine, MD . Electronically Signed: Levon Hedger, Scribe. 12/12/2016. 11:13 PM.   History   Chief Complaint Chief Complaint  Patient presents with  . blood in semen   HPI Troy Edwards is a 31 y.o. male who presents to the Emergency Department complaining of hematospermia noticed one hour PTA. Pt states he was having intercourse about one hour ago and noticed bloody semen collected in the condom.  He also reports some associated moderate lower back pain onset yesterday. Pt states he recently was disqualified from joining the [employer] due to high protein and creatinine levels in his urine. The patient is currently on no regular medications and has not been on any abx recently. No recent trauma to the groin. No hx of STI. He denies any penile pain, penile discharge, testicular pain, dysuria, hematuria, fever, chills, or abdominal pain.  He reports he has "pain in my prostate" at times and thinks he has prostatitis.  Pt has no other complaints or symptoms at this time.   The history is provided by the patient. No language interpreter was used.   Past Medical History:  Diagnosis Date  . Acute renal failure (HCC)     There are no active problems to display for this patient.   Past Surgical History:  Procedure Laterality Date  . LEG SURGERY      Home Medications    Prior to Admission medications   Not on File    Family History History reviewed. No pertinent family history.  Social History Social History  Substance Use Topics  . Smoking status: Never Smoker  . Smokeless tobacco: Never Used  . Alcohol use Yes     Comment: occas    Allergies   Patient has no known allergies.   Review of Systems Review of Systems  Constitutional: Negative for  chills and fever.  Gastrointestinal: Negative for abdominal pain.  Genitourinary: Negative for discharge, dysuria, hematuria, penile pain and testicular pain.       +Hematospermia  Musculoskeletal: Positive for back pain.  All other systems reviewed and are negative.  Physical Exam Updated Vital Signs BP 141/93 (BP Location: Left Arm)   Pulse 95   Temp 98.2 F (36.8 C) (Oral)   Resp 18   Ht 5\' 9"  (1.753 m)   Wt 180 lb (81.6 kg)   SpO2 99%   BMI 26.58 kg/m   Physical Exam CONSTITUTIONAL: Well developed/well nourished HEAD: Normocephalic/atraumatic EYES: EOMI/PERRL ENMT: Mucous membranes moist NECK: supple no meningeal signs SPINE/BACK:entire spine nontender CV: S1/S2 noted, no murmurs/rubs/gallops noted LUNGS: Lungs are clear to auscultation bilaterally, no apparent distress ABDOMEN: soft, nontender, no rebound or guarding, bowel sounds noted throughout abdomen GU:no cva tenderness. No penile discharge, no penile lesions. No scrotal tenderness or edema. Chaperone present. NEURO: Pt is awake/alert/appropriate, moves all extremitiesx4.  No facial droop.   EXTREMITIES: pulses normal/equal, full ROM SKIN: warm, color normal PSYCH: no abnormalities of mood noted, alert and oriented to situation   ED Treatments / Results  DIAGNOSTIC STUDIES:  Oxygen Saturation is 99% on RA, normal by my interpretation.    COORDINATION OF CARE:  11:21 PM Discussed treatment plan with pt at bedside and pt agreed to plan.   Labs (all labs ordered are listed, but only abnormal results are  displayed) Labs Reviewed  URINALYSIS, ROUTINE W REFLEX MICROSCOPIC - Abnormal; Notable for the following:       Result Value   Hgb urine dipstick MODERATE (*)    Protein, ur >=300 (*)    All other components within normal limits  GC[facility]CHLAMYDIA PROBE AMP [facility]    EKG  EKG Interpretation None       Radiology No results found.  Procedures Procedures (including critical care  time)  Medications Ordered in ED Medications  cefTRIAXone (ROCEPHIN) injection 250 mg (not administered)  doxycycline (VIBRA-TABS) tablet 100 mg (not administered)     Initial Impression / Assessment and Plan / ED Course  I have reviewed the triage vital signs and the nursing notes.  Pertinent labs   results that were available during my care of the patient were reviewed by me and considered in my medical decision making (see chart for details).      Pt stable Exam unremarkable He has both blood and evidence of infection in urine GC/chlamydia testing sent Advised to avoid intercourse until all labs resulted He reports feeling as though he may have prostatitis, doxycycline x14 days given  As for recent elevated creatinine/proteinuria, stressed need for outpatient followup with PCP and urology   Final Clinical Impressions(s) / ED Diagnoses   Final diagnoses:  Hematospermia  Persistent proteinuria   New Prescriptions New Prescriptions   DOXYCYCLINE (VIBRAMYCIN) 100 MG CAPSULE    One po bid x 14 days  I personally performed the services described in this documentation, which was scribed in my presence. The recorded information has been reviewed and is accurate.        Zadie Rhineonald Finnigan Warriner, MD 12/12/16 [phone]

## 2016-12-13 NOTE — ED Notes (Signed)
Patient verbalized understanding of discharge instructions and follow up. No ss anaphylaxsis. Discharged with girlfriend accomopanying.

## 2016-12-14 LAB — GC/CHLAMYDIA PROBE AMP (~~LOC~~) NOT AT ARMC
Chlamydia: POSITIVE — AB
Neisseria Gonorrhea: NEGATIVE
Trichomonas: NEGATIVE

## 2017-02-25 ENCOUNTER — Ambulatory Visit: Payer: BLUE CROSS/BLUE SHIELD | Admitting: Urology

## 2017-10-25 ENCOUNTER — Other Ambulatory Visit: Payer: Self-pay

## 2017-10-25 ENCOUNTER — Encounter (HOSPITAL_COMMUNITY): Payer: Self-pay | Admitting: Emergency Medicine

## 2017-10-25 DIAGNOSIS — Z202 Contact with and (suspected) exposure to infections with a predominantly sexual mode of transmission: Secondary | ICD-10-CM | POA: Insufficient documentation

## 2017-10-25 DIAGNOSIS — N50811 Right testicular pain: Secondary | ICD-10-CM | POA: Diagnosis present

## 2017-10-25 DIAGNOSIS — N451 Epididymitis: Secondary | ICD-10-CM | POA: Insufficient documentation

## 2017-10-25 LAB — URINALYSIS, ROUTINE W REFLEX MICROSCOPIC
Bacteria, UA: NONE SEEN
Bilirubin Urine: NEGATIVE
GLUCOSE, UA: NEGATIVE mg/dL
HGB URINE DIPSTICK: NEGATIVE
KETONES UR: NEGATIVE mg/dL
LEUKOCYTES UA: NEGATIVE
NITRITE: NEGATIVE
PH: 7 (ref 5.0–8.0)
Protein, ur: >=300 mg/dL — AB
Specific Gravity, Urine: 1.017 (ref 1.005–1.030)
Squamous Epithelial / LPF: NONE SEEN

## 2017-10-25 NOTE — ED Triage Notes (Signed)
Pt had unprotected sex, two time in the last two weeks, noticed some discharge when straining for bowel movement

## 2017-10-25 NOTE — ED Triage Notes (Signed)
Right testicle pain, noticed today while walking.

## 2017-10-26 ENCOUNTER — Emergency Department (HOSPITAL_COMMUNITY)
Admission: EM | Admit: 2017-10-26 | Discharge: 2017-10-26 | Disposition: A | Discharge status: Home / Self Care | Payer: BLUE CROSS/BLUE SHIELD | Attending: Emergency Medicine | Admitting: Emergency Medicine

## 2017-10-26 ENCOUNTER — Emergency Department (HOSPITAL_COMMUNITY): Payer: BLUE CROSS/BLUE SHIELD

## 2017-10-26 DIAGNOSIS — N451 Epididymitis: Secondary | ICD-10-CM

## 2017-10-26 DIAGNOSIS — N50811 Right testicular pain: Secondary | ICD-10-CM

## 2017-10-26 DIAGNOSIS — Z202 Contact with and (suspected) exposure to infections with a predominantly sexual mode of transmission: Secondary | ICD-10-CM

## 2017-10-26 MED ORDER — KETOROLAC TROMETHAMINE 30 MG/ML IJ SOLN
INTRAMUSCULAR | Status: AC
  Filled 2017-10-26: qty 1

## 2017-10-26 MED ORDER — CEFTRIAXONE SODIUM 250 MG IJ SOLR
250.0000 mg | Freq: Once | INTRAMUSCULAR | Status: AC
  Administered 2017-10-26: 250 mg via INTRAMUSCULAR
  Filled 2017-10-26: qty 250

## 2017-10-26 MED ORDER — DOXYCYCLINE HYCLATE 100 MG PO CAPS: 100.0000 mg | ORAL_CAPSULE | Freq: Two times a day (BID) | ORAL | 0 refills | Status: AC

## 2017-10-26 MED ORDER — KETOROLAC TROMETHAMINE 30 MG/ML IJ SOLN
30.0000 mg | Freq: Once | INTRAMUSCULAR | Status: AC
  Administered 2017-10-26: 30 mg via INTRAMUSCULAR

## 2017-10-26 MED ORDER — LIDOCAINE HCL (PF) 1 % IJ SOLN
INTRAMUSCULAR | Status: AC
  Administered 2017-10-26: 2 mL
  Filled 2017-10-26: qty 2

## 2017-10-26 NOTE — ED Notes (Signed)
Pt states right testicle pain that started yesterday morning. No swelling noted pt says is tender to touch.

## 2017-10-26 NOTE — ED Provider Notes (Signed)
[facility] EMERGENCY DEPARTMENT Provider Note   CSN: [id number] Arrival date & time: 10/25/17  2202  Time seen 01:30 AM  History   Chief Complaint Chief Complaint  Patient presents with  . S74.5    HPI Troy Edwards is a 32 y.o. male.  HPI patient states he started having pain in his right testicle about noon.  He describes it as dull and aching.  He thought maybe there was some swelling earlier.  He states it is painful if he touches it but walking does not make the pain worse.  He also states he had some penile discharge a few days ago while straining to have a BM, he states he saw it 2 or 3 times.  He does report some unprotected sex in the past couple of weeks.  He denies staining of his underwear.  PCP Patient, No Pcp Per   Past Medical History:  Diagnosis Date  . Acute renal failure (HCC)     There are no active problems to display for this patient.   Past Surgical History:  Procedure Laterality Date  . LEG SURGERY         Home Medications    Prior to Admission medications   Medication Sig Start Date End Date Taking? Authorizing Provider  doxycycline (VIBRAMYCIN) 100 MG capsule Take 1 capsule (100 mg total) by mouth 2 (two) times daily. 10/26/17   Devoria Albe, MD    Family History No family history on file.  Social History Social History   Tobacco Use  . Smoking status: Never Smoker  . Smokeless tobacco: Never Used  Substance Use Topics  . Alcohol use: Yes    Comment: occas  . Drug use: No     Allergies   Patient has no known allergies.   Review of Systems Review of Systems  All other systems reviewed and are negative.    Physical Exam Updated Vital Signs BP (!) 137/102 (BP Location: Right Arm)   Pulse (!) 105   Temp 98.4 F (36.9 C) (Oral)   Resp 18   Ht 5\' 7"  (1.702 m)   Wt 83 kg (183 lb)   SpO2 99%   BMI 28.66 kg/m   Vital signs normal except tachycardia and diastolic hypertension   Physical Exam  Constitutional: He  appears well-developed and well-nourished. No distress.  HENT:  Head: Normocephalic and atraumatic.  Right Ear: External ear normal.  Left Ear: External ear normal.  Nose: Nose normal.  Eyes: Conjunctivae and EOM are normal.  Neck: Normal range of motion.  Cardiovascular: Normal rate.  Pulmonary/Chest: Effort normal. No respiratory distress.  Genitourinary: Penis normal.  Genitourinary Comments: Patient does have some bloody urethral drainage. Patient's left testicle is normal size and nontender.  He has some mild tenderness over the epididymis on the left.  His right testicle appears to be larger than the left and appears mildly tender.  He also is tender over the right epididymis.  Nursing note and vitals reviewed.    ED Treatments / Results  Labs (all labs ordered are listed, but only abnormal results are displayed) Results for orders placed or performed during the hospital encounter of 10/26/17  Urinalysis, Routine w reflex microscopic  Result Value Ref Range   Color, Urine YELLOW YELLOW   APPearance CLEAR CLEAR   Specific Gravity, Urine 1.017 1.005 - 1.030   pH 7.0 5.0 - 8.0   Glucose, UA NEGATIVE NEGATIVE mg/dL   Hgb urine dipstick NEGATIVE NEGATIVE   Bilirubin  Urine NEGATIVE NEGATIVE   Ketones, ur NEGATIVE NEGATIVE mg/dL   Protein, ur >=161>=300 (A) NEGATIVE mg/dL   Nitrite NEGATIVE NEGATIVE   Leukocytes, UA NEGATIVE NEGATIVE   RBC / HPF 0-5 0 - 5 RBC/hpf   WBC, UA 0-5 0 - 5 WBC/hpf   Bacteria, UA NONE SEEN NONE SEEN   Squamous Epithelial / LPF NONE SEEN NONE SEEN   Mucus PRESENT    Hyaline Casts, UA PRESENT    Laboratory interpretation all normal     EKG  EKG Interpretation None       Radiology Koreas Scrotum  [place] Abdominal Pelvic Art/vent Flow Doppler Limited  Result Date: 10/26/2017 CLINICAL DATA:  Rights scrotal pain and swelling EXAM: SCROTAL ULTRASOUND DOPPLER ULTRASOUND OF THE TESTICLES TECHNIQUE: Complete ultrasound examination of the testicles,  epididymis, and other scrotal structures was performed. Color and spectral Doppler ultrasound were also utilized to evaluate blood flow to the testicles. COMPARISON:  06/13/2016 FINDINGS: Right testicle Measurements: 4.3 x 2.2 x 3.1 cm. No mass or microlithiasis visualized. Left testicle Measurements: 3.8 x 2.2 x 3.3 cm. No mass or microlithiasis visualized. Right epididymis:  Slightly enlarged hyperemic epididymis. Left epididymis:  Normal in size and appearance. Hydrocele: Small bilateral hydroceles, particulate foci within the left hydrocele. Varicocele:  None visualized. Pulsed Doppler interrogation of both testes demonstrates normal low resistance arterial and venous waveforms bilaterally. IMPRESSION: 1. Negative for testicular torsion 2. Slightly enlarged hyperemic right epididymis suggestive of epididymitis 3. Small bilateral hydroceles Electronically Signed   By: Jasmine PangKim  Fujinaga M.D.   On: 10/26/2017 04:02    Procedures Procedures (including critical care time)  Medications Ordered in ED Medications  cefTRIAXone (ROCEPHIN) injection 250 mg (250 mg Intramuscular Given 10/26/17 0336)  ketorolac (TORADOL) 30 MG/ML injection 30 mg (30 mg Intramuscular Given 10/26/17 0336)  lidocaine (PF) (XYLOCAINE) 1 % injection (2 mLs  Given 10/26/17 0336)     Initial Impression / Assessment and Plan / ED Course  I have reviewed the triage vital signs and the nursing notes.  Pertinent labs & imaging results that were available during my care of the patient were reviewed by me and considered in my medical decision making (see chart for details).    Patient wanted to be evaluated for possible other STD exposures such as syphilis or HIV.  Those blood tests were drawn.  He also want to go ahead and start being treated and he was given Rocephin IM.  He was given Toradol IM for pain.  Ultrasound was done of his testicle to look for torsion, however it looks like he has epididymitis which also fits with his exam.  He  was given doxycycline for the treatment of the epididymitis.  He can check in my chart in a couple days for his STD results or they should call him if his results are positive.  He was advised to have his sexual partner get examined.  Final Clinical Impressions(s) / ED Diagnoses   Final diagnoses:  Pain in right testicle  Epididymitis, right  STD exposure    ED Discharge Orders        Ordered    doxycycline (VIBRAMYCIN) 100 MG capsule  2 times daily     10/26/17 0436    OTC ibuprofen   Plan discharge  Devoria AlbeIva Mary Secord, MD, [facility] PyoFACEP     Marlos Carmen, MD 10/26/17 [phone]

## 2017-10-26 NOTE — Discharge Instructions (Signed)
Wear an athletic support for comfort. Take ibuprofen 600 mg 4 times a day for pain. Take the antibiotic until gone. You will be called if your STD tests are positive and you can also check in My Chart in a few days. You should have your sexual partner get evaluated by their primary care doctor or the health department. USE [employer]DOMS!!!

## 2017-10-26 NOTE — ED Notes (Signed)
Pt alert & oriented x4, stable gait. Patient given discharge instructions, paperwork & prescription(s). Patient  instructed to stop at the registration desk to finish any additional paperwork. Patient verbalized understanding. Pt left department w/ no further questions. 

## 2017-10-27 LAB — GC/CHLAMYDIA PROBE AMP (~~LOC~~) NOT AT ARMC
Chlamydia: NEGATIVE
Neisseria Gonorrhea: NEGATIVE

## 2017-10-27 LAB — HIV ANTIBODY (ROUTINE TESTING W REFLEX): HIV SCREEN 4TH GENERATION: NONREACTIVE

## 2017-10-27 LAB — RPR: RPR Ser Ql: NONREACTIVE

## 2018-01-18 ENCOUNTER — Other Ambulatory Visit: Payer: Self-pay

## 2018-01-18 ENCOUNTER — Emergency Department (HOSPITAL_COMMUNITY)
Admission: EM | Admit: 2018-01-18 | Discharge: 2018-01-18 | Disposition: A | Discharge status: Home / Self Care | Payer: BLUE CROSS/BLUE SHIELD | Attending: Emergency Medicine | Admitting: Emergency Medicine

## 2018-01-18 ENCOUNTER — Encounter (HOSPITAL_COMMUNITY): Payer: Self-pay

## 2018-01-18 DIAGNOSIS — H1013 Acute atopic conjunctivitis, bilateral: Secondary | ICD-10-CM | POA: Diagnosis not present

## 2018-01-18 DIAGNOSIS — H5713 Ocular pain, bilateral: Secondary | ICD-10-CM | POA: Diagnosis present

## 2018-01-18 DIAGNOSIS — Z79899 Other long term (current) drug therapy: Secondary | ICD-10-CM | POA: Insufficient documentation

## 2018-01-18 MED ORDER — FLUORESCEIN SODIUM 1 MG OP STRP
1.0000 | ORAL_STRIP | Freq: Once | OPHTHALMIC | Status: AC
  Administered 2018-01-18: 1 via OPHTHALMIC
  Filled 2018-01-18: qty 1

## 2018-01-18 MED ORDER — OLOPATADINE HCL 0.2 % OP SOLN: 1.0000 [drp] | Freq: Every day | OPHTHALMIC | 0 refills | Status: AC

## 2018-01-18 MED ORDER — TETRACAINE HCL 0.5 % OP SOLN
2.0000 [drp] | Freq: Once | OPHTHALMIC | Status: AC
  Administered 2018-01-18: 2 [drp] via OPHTHALMIC
  Filled 2018-01-18: qty 4

## 2018-01-18 NOTE — ED Notes (Signed)
tetracaine ,  fluorescein , and lamp at bedside.

## 2018-01-18 NOTE — ED Notes (Signed)
Visual acquity 20/25 both eyes

## 2018-01-18 NOTE — ED Notes (Signed)
Pt resting in bed on cell phone

## 2018-01-18 NOTE — ED Notes (Signed)
EDP at bedside  

## 2018-01-18 NOTE — ED Provider Notes (Signed)
[facility] EMERGENCY DEPARTMENT Provider Note   CSN: [id number] Arrival date & time: 01/18/18  0355     History   Chief Complaint Chief Complaint  Patient presents with  . Eye Problem    HPI Troy Edwards is a 32 y.o. male.  The history is provided by the patient.  Eye Problem   This is a new problem. The current episode started more than 2 days ago. The problem occurs daily. The problem has been gradually worsening. There is a problem in both eyes. There was no injury mechanism. The pain is mild. There is no history of trauma to the eye. He does not wear contacts. Associated symptoms include discharge and eye redness. Pertinent negatives include no double vision, no foreign body sensation, no photophobia and no vomiting.  Patient with bilateral eye redness and drainage.  No known exposures or trauma.  He does not wear contacts.  He has tried over-the-counter allergic drops without any improvement  Past Medical History:  Diagnosis Date  . Acute renal failure (HCC)     There are no active problems to display for this patient.   Past Surgical History:  Procedure Laterality Date  . LEG SURGERY          Home Medications    Prior to Admission medications   Medication Sig Start Date End Date Taking? Authorizing Provider  doxycycline (VIBRAMYCIN) 100 MG capsule Take 1 capsule (100 mg total) by mouth 2 (two) times daily. 10/26/17   Devoria Albe, MD  Olopatadine HCl 0.2 % SOLN Apply 1 drop to eye daily for 10 days. 01/18/18 01/28/18  Zadie Rhine, MD    Family History History reviewed. No pertinent family history.  Social History Social History   Tobacco Use  . Smoking status: Never Smoker  . Smokeless tobacco: Never Used  Substance Use Topics  . Alcohol use: Yes    Comment: occas  . Drug use: No     Allergies   Patient has no known allergies.   Review of Systems Review of Systems  Constitutional: Negative for fever.  Eyes: Positive for discharge and  redness. Negative for double vision and photophobia.  Gastrointestinal: Negative for vomiting.     Physical Exam Updated Vital Signs BP (!) 137/102   Pulse 88   Temp 97.8 F (36.6 C)   Resp 18   Ht 1.727 m (5\' 8" )   Wt 82.1 kg (181 lb)   SpO2 97%   BMI 27.52 kg/m   Physical Exam CONSTITUTIONAL: Well developed/well nourished HEAD: Normocephalic/atraumatic EYES: EOMI/PERRL, no proptosis, no herpetic lesions, no dendritic lesions lesions, no corneal abrasions, no foreign bodies, bilateral conjunctival erythema, clear discharge noted, no photophobia, no consensual pain ENMT: Mucous membranes moist NEURO: Pt is awake/alert/appropriate, moves all extremitiesx4.  EXTREMITIES:  full ROM SKIN: warm, color normal PSYCH: no abnormalities of mood noted, alert and oriented to situation   ED Treatments / Results  Labs (all labs ordered are listed, but only abnormal results are displayed) Labs Reviewed - No data to display  EKG None  Radiology No results found.  Procedures Procedures  Medications Ordered in ED Medications  tetracaine (PONTOCAINE) 0.5 % ophthalmic solution 2 drop (has no administration in time range)  fluorescein ophthalmic strip 1 strip (has no administration in time range)     Initial Impression / Assessment and Plan / ED Course  I have reviewed the triage vital signs and the nursing notes.      Visual acuity noted.  No  corneal haziness to suggest glaucoma.  No photophobia or consensual pain to suggest iritis.  No abrasions or foreign bodies noted.  His symptoms are bilateral, strong suspicion for allergic type conjunctivitis or potentially viral conjunctivitis  Referred to ophthalmology.  prescription medications were ordered  Final Clinical Impressions(s) / ED Diagnoses   Final diagnoses:  Allergic conjunctivitis of both eyes    ED Discharge Orders        Ordered    Olopatadine HCl 0.2 % SOLN  Daily     01/18/18 0438       Zadie RhineWickline,  Taffy Delconte, MD 01/18/18 [phone]

## 2018-01-18 NOTE — ED Triage Notes (Addendum)
Eyes became red Thursday and progressively got worse. Started hurting Sunday 4/10.  Woke up this morning with blurry vision. Both eyes are red.  Clear drainage present. Currently 4/10 pain . Stated nothing that he knows of has got into his eyes or been scratched

## 2018-01-27 IMAGING — US US SCROTUM
1 series · 13 of 25 positions shown · non-contrast
Comparison: 05/25/2016

CLINICAL DATA: Right inferior scrotal lump for 3 weeks

EXAM:
SCROTAL ULTRASOUND
DOPPLER ULTRASOUND OF THE TESTICLES
TECHNIQUE: Complete ultrasound examination of the testicles, epididymis, and
other scrotal structures was performed. Color and spectral Doppler
ultrasound were also utilized to evaluate blood flow to the
testicles.

[Series 1: us scrotum · 0.07mm/px · 13 of 43 slices shown]
[im 1/43]
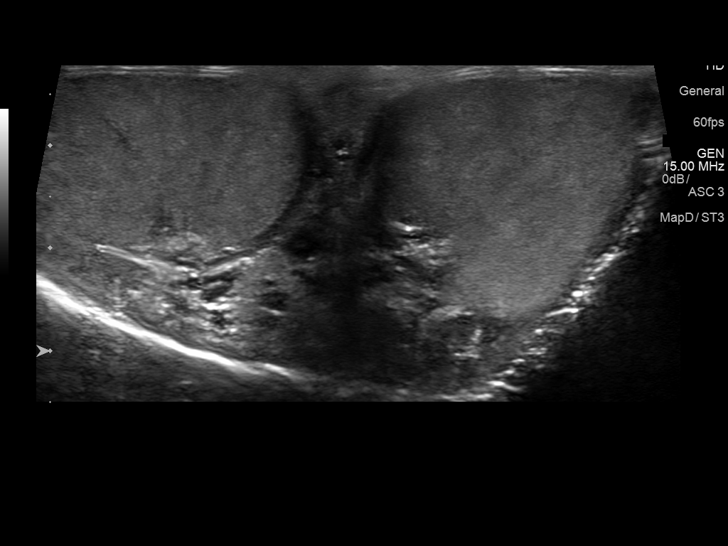
[im 4/43]
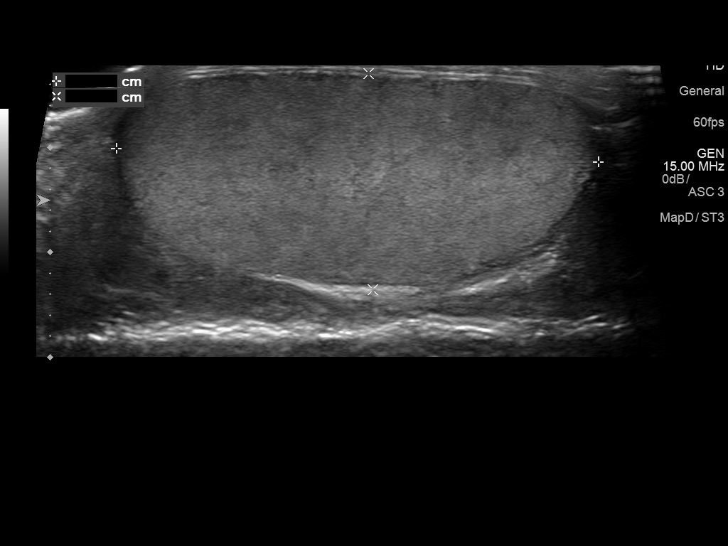
[im 8/43]
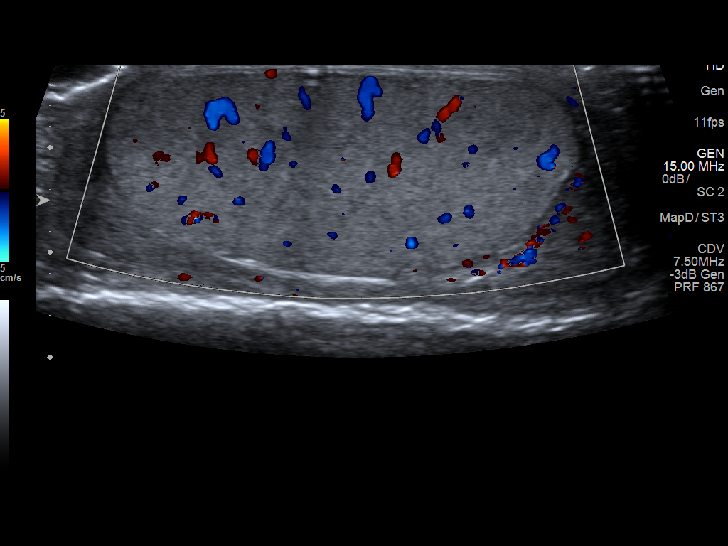
[im 11/43]
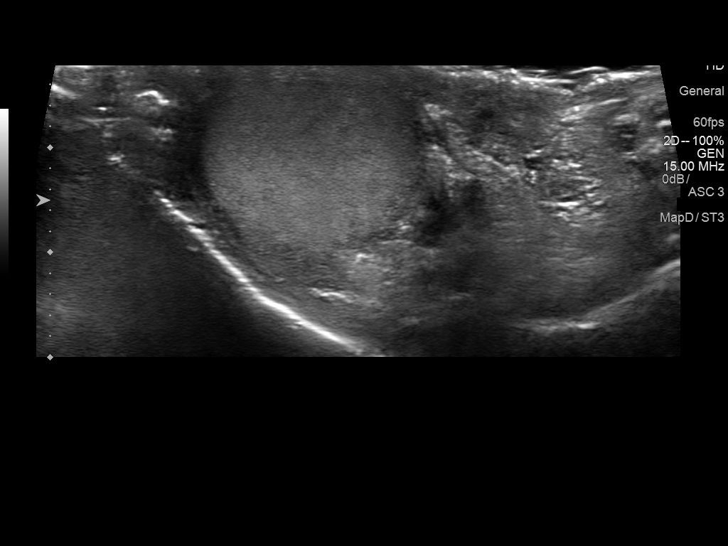
[im 15/43]
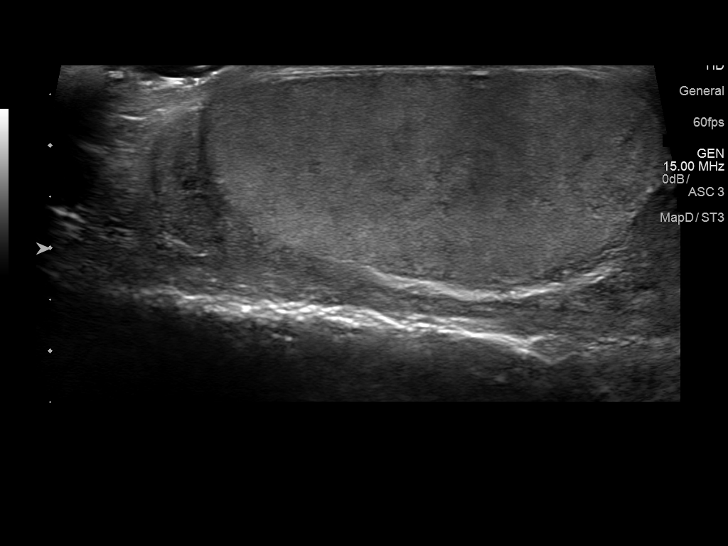
[im 18/43]
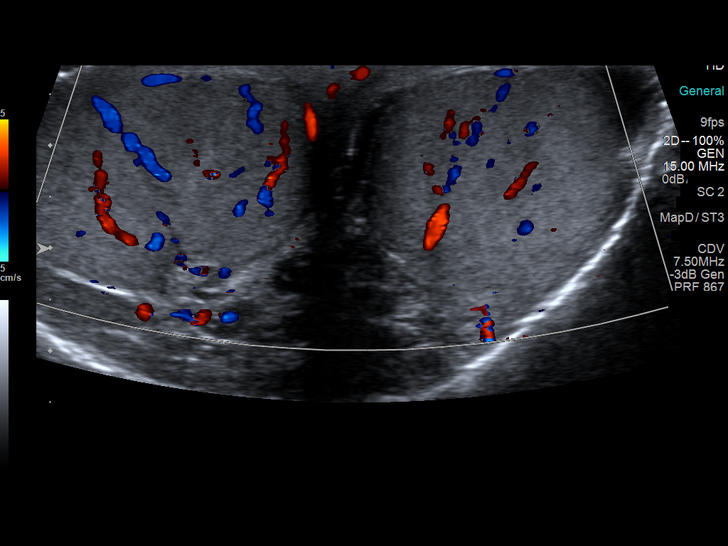
[im 22/43]
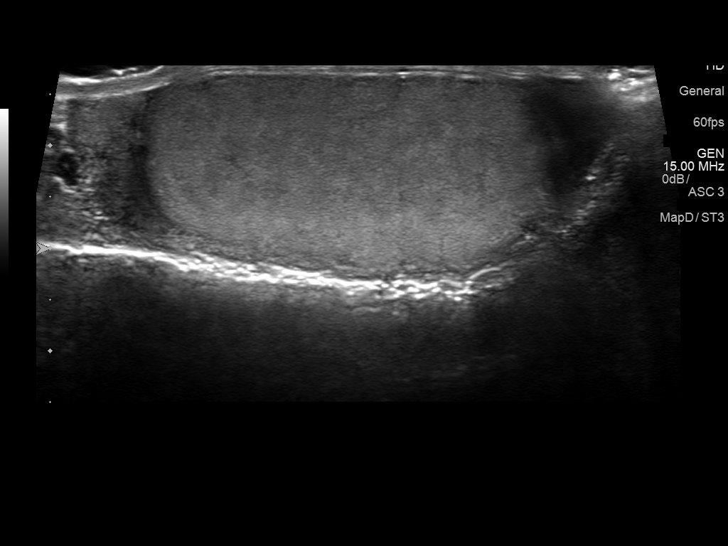
[im 25/43]
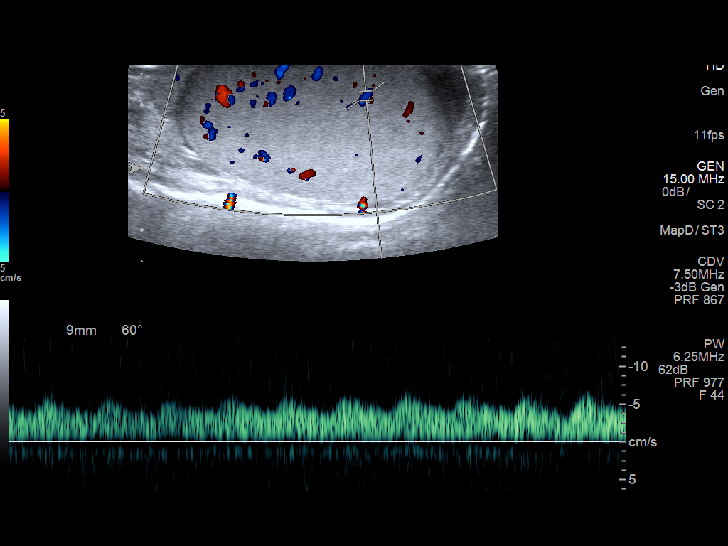
[im 29/43]
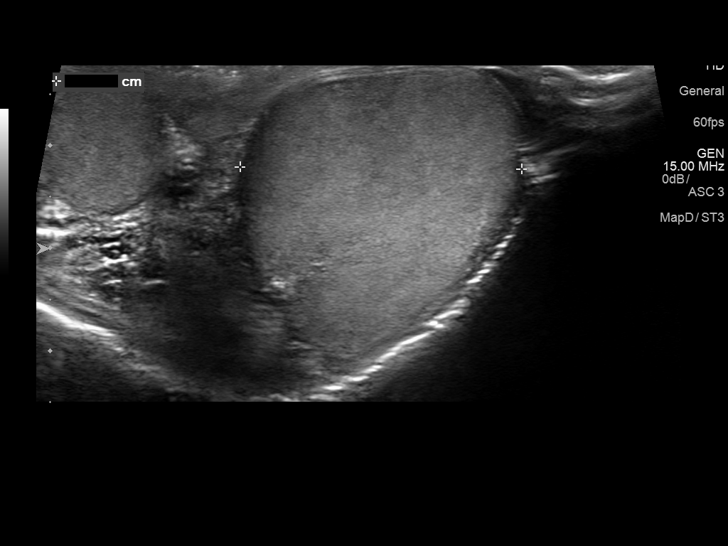
[im 32/43]
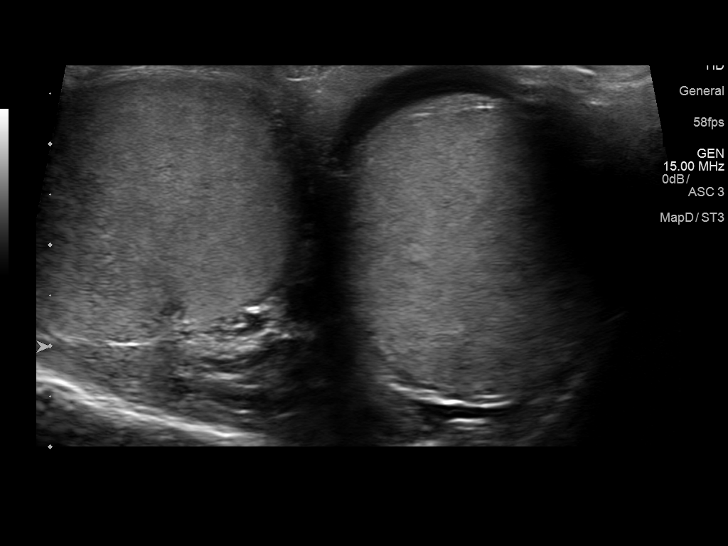
[im 36/43]
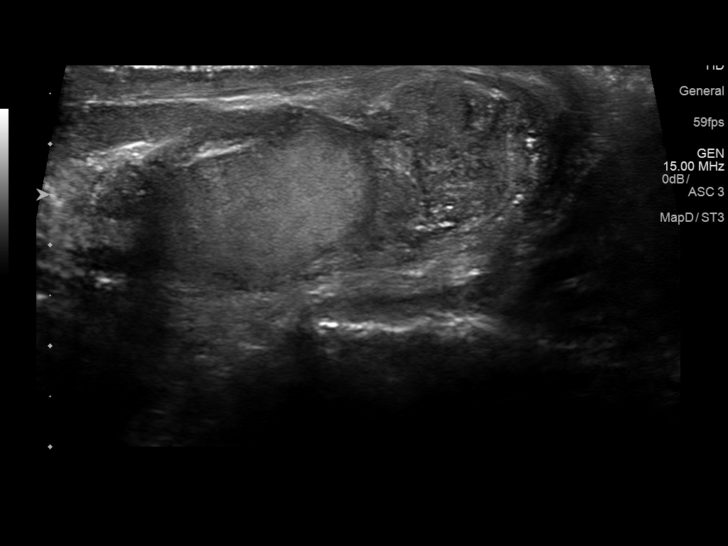
[im 39/43]
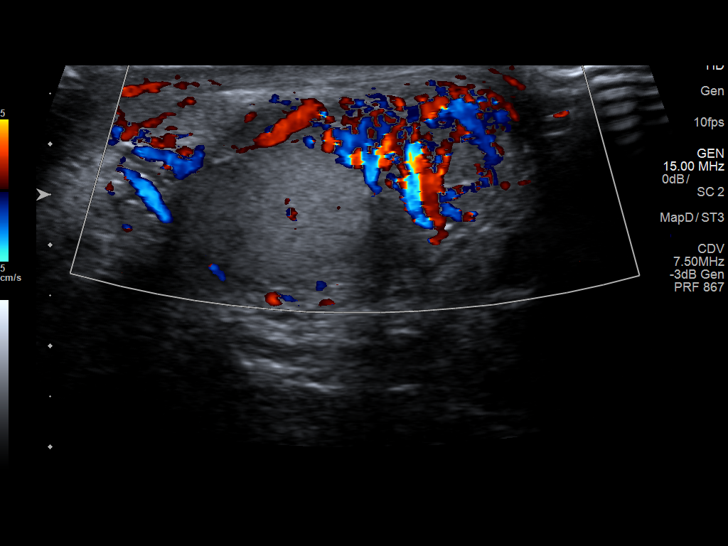
[im 43/43]
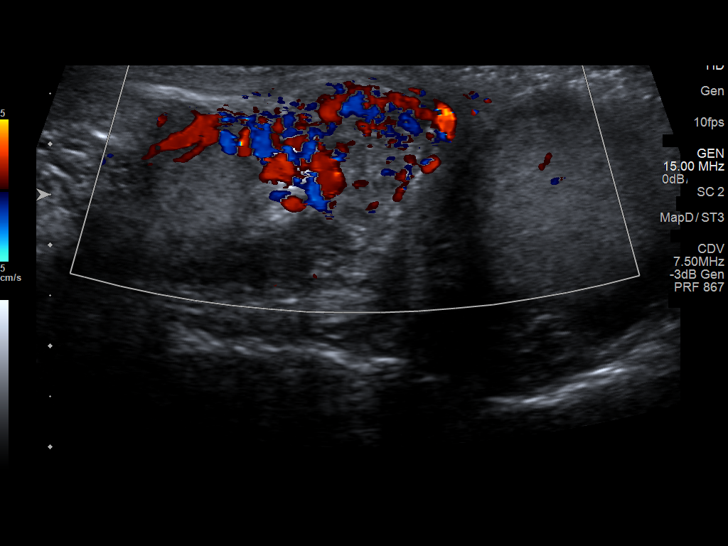

[13 of 25 positions shown; findings below may reference images not displayed]

FINDINGS: Right testicle

Measurements: 4.6 x 2.1 x 2.8 cm. No mass or microlithiasis
visualized.

Left testicle

Measurements: 4.4 x 2.4 x 2.7 cm. No mass or microlithiasis
visualized.

Right epididymis: The right epididymis is enlarged, particularly in
the tail. The enlarged epididymal tail corresponds to the
abnormality demonstrated by the patient. The epididymis is quite
hyperemic on Doppler.

Left epididymis:  Normal in size and appearance.

Hydrocele:  Small left hydrocele

Varicocele:  None visualized.

Pulsed Doppler interrogation of both testes demonstrates normal low
resistance arterial and venous waveforms bilaterally.
IMPRESSION: 1. The area of concern indicated by the patient corresponds to the
right epididymal tail, which is moderately enlarged. The right
epididymis is quite hyperemic on Doppler. This likely represents
epididymitis.
2. Both testes are normal.  No testicular mass or torsion.

## 2018-09-25 ENCOUNTER — Emergency Department (HOSPITAL_COMMUNITY)
Admission: EM | Admit: 2018-09-25 | Discharge: 2018-09-26 | Disposition: A | Discharge status: Home / Self Care | Payer: BLUE CROSS/BLUE SHIELD | Attending: Emergency Medicine | Admitting: Emergency Medicine

## 2018-09-25 ENCOUNTER — Other Ambulatory Visit: Payer: Self-pay

## 2018-09-25 ENCOUNTER — Encounter (HOSPITAL_COMMUNITY): Payer: Self-pay | Admitting: Emergency Medicine

## 2018-09-25 DIAGNOSIS — J020 Streptococcal pharyngitis: Secondary | ICD-10-CM | POA: Insufficient documentation

## 2018-09-25 DIAGNOSIS — J029 Acute pharyngitis, unspecified: Secondary | ICD-10-CM | POA: Diagnosis present

## 2018-09-25 NOTE — ED Triage Notes (Signed)
Pt states sore throat with body aches and congestion since early this am. Pt report took tylenol approx 6 hours ago.

## 2018-09-26 LAB — GROUP A STREP BY PCR: Group A Strep by PCR: DETECTED — AB

## 2018-09-26 MED ORDER — PENICILLIN G BENZATHINE 1200000 UNIT/2ML IM SUSP
1.2000 10*6.[IU] | Freq: Once | INTRAMUSCULAR | Status: AC
  Administered 2018-09-26: 1.2 10*6.[IU] via INTRAMUSCULAR
  Filled 2018-09-26: qty 2

## 2018-09-26 NOTE — ED Provider Notes (Signed)
[facility] EMERGENCY DEPARTMENT Provider Note   CSN: [id number] Arrival date & time: 09/25/18  2223  Time seen 23:12 PM   History   Chief Complaint Chief Complaint  Patient presents with  . Sore Throat    HPI Troy Edwards is a 32 y.o. male.  HPI patient states he started feeling bad this morning with a sore throat.  He states about 4 hours ago it got worse with body aches, feeling lethargic, and foggy headed.  He states he had a temp of 99 at home.  He has had a mild rhinorrhea that is white but only when he is outside in the cold, never inside when it is warm.  He had some mild nausea earlier but not now.  He denies cough, vomiting, or diarrhea.  He denies ear pain.  He denies being around anyone that he knows is sick.  He states he did have strep throat in April.  He states he starting to have some mild abdominal soreness.  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  PCP Patient, No Pcp Per   Past Medical History:  Diagnosis Date  . Acute renal failure (HCC)     There are no active problems to display for this patient.   Past Surgical History:  Procedure Laterality Date  . LEG SURGERY          Home Medications    Prior to Admission medications   Medication Sig Start Date End Date Taking? Authorizing Provider  doxycycline (VIBRAMYCIN) 100 MG capsule Take 1 capsule (100 mg total) by mouth 2 (two) times daily. 10/26/17   Devoria AlbeKnapp, Zailah Zagami, MD    Family History History reviewed. No pertinent family history.  Social History Social History   Tobacco Use  . Smoking status: Never Smoker  . Smokeless tobacco: Never Used  Substance Use Topics  . Alcohol use: Yes    Comment: occas  . Drug use: No  employed   Allergies   Patient has no known allergies.   Review of Systems Review of Systems  All other systems reviewed and are negative.    Physical Exam Updated Vital Signs BP (!) 150/61 (BP Location: Right Arm)    Pulse (!) 114   Temp 99.4 F (37.4 C) (Oral)   Resp 18   Ht 5\' 8"  (1.727 m)   Wt 85.7 kg   SpO2 98%   BMI 28.74 kg/m   Vital signs normal except for tachycardia   Physical Exam Vitals signs and nursing note reviewed.  Constitutional:      General: He is not in acute distress.    Appearance: Normal appearance. He is well-developed. He is not ill-appearing or toxic-appearing.  HENT:     Head: Normocephalic and atraumatic.     Jaw: No trismus.     Right Ear: External ear normal.     Left Ear: External ear normal.     Nose: Nose normal. No mucosal edema or rhinorrhea.     Mouth/Throat:     Dentition: No dental abscesses.     Pharynx: No uvula swelling.     Comments: Patient is noted to have intense redness of the distal soft palate and the tonsils, I do not see any active purulence at this time.  There is no soft palate swelling, uvula deviation or significant tonsillar enlargement.  He is able to swallow and his voice is normal. Eyes:     Conjunctiva/sclera: Conjunctivae normal.  Pupils: Pupils are equal, round, and reactive to light.  Neck:     Musculoskeletal: Full passive range of motion without pain, normal range of motion and neck supple.  Cardiovascular:     Rate and Rhythm: Regular rhythm. Tachycardia present.     Heart sounds: Normal heart sounds.  Pulmonary:     Effort: Pulmonary effort is normal. No respiratory distress.     Breath sounds: No wheezing, rhonchi or rales.  Chest:     Chest wall: No tenderness or crepitus.  Abdominal:     General: There is no distension.  Musculoskeletal: Normal range of motion.        General: No tenderness.     Comments: Moves all extremities well.   Lymphadenopathy:     Cervical: No cervical adenopathy.  Skin:    General: Skin is warm and dry.     Coloration: Skin is not pale.     Findings: No erythema or rash.  Neurological:     Mental Status: He is alert and oriented to person, place, and time.     Cranial Nerves: No  cranial nerve deficit.  Psychiatric:        Mood and Affect: Mood is not anxious.        Speech: Speech normal.        Behavior: Behavior normal.      ED Treatments / Results  Labs (all labs ordered are listed, but only abnormal results are displayed) Labs Reviewed  GROUP A STREP BY PCR - Abnormal; Notable for the following components:      Result Value   Group A Strep by PCR DETECTED (*)    All other components within normal limits    EKG None  Radiology No results found.  Procedures Procedures (including critical care time)  Medications Ordered in ED Medications  penicillin g benzathine (BICILLIN LA) 1200000 UNIT/2ML injection 1.2 Million Units (has no administration in time range)     Initial Impression / Assessment and Plan / ED Course  I have reviewed the triage vital signs and the nursing notes.  Pertinent labs & imaging results that were available during my care of the patient were reviewed by me and considered in my medical decision making (see chart for details).    Patient strep was positive, he elected to do the Bicillin LA IM.  He has had strep twice this year, I suggested he wear a mask when he is working, if he has another episode he at that point he should consider seeing an ENT specialist.  His tonsils are not overly enlarged on exam today.  Final Clinical Impressions(s) / ED Diagnoses   Final diagnoses:  Strep pharyngitis    ED Discharge Orders    None    OTC ibuprofen and acetaminophen  Plan discharge  Devoria AlbeIva Roper Tolson, MD, [facility] PyoFACEP     Kare Dado, MD 09/26/18 [phone]

## 2018-09-26 NOTE — Discharge Instructions (Addendum)
Drink plenty of fluids, cold liquids may help numb your throat and make it easier to swallow.  Take ibuprofen 600 mg plus acetaminophen 1000 mg every 6 hours as needed for pain or fever.  Recheck if your pain is getting worse, you are unable to swallow, you have difficulty breathing.

## 2019-06-11 IMAGING — US US ART/VEN ABD/PELV/SCROTUM DOPPLER LTD
1 series · 14 of 25 positions shown · non-contrast
Comparison: 06/13/2016

CLINICAL DATA: Rights scrotal pain and swelling

EXAM:
SCROTAL ULTRASOUND
DOPPLER ULTRASOUND OF THE TESTICLES
TECHNIQUE: Complete ultrasound examination of the testicles, epididymis, and
other scrotal structures was performed. Color and spectral Doppler
ultrasound were also utilized to evaluate blood flow to the
testicles.

[Series 1: us art/ven abd/pelv/scrotum doppler ltd · 0.06mm/px · 14 of 69 slices shown]
[im 1/69]
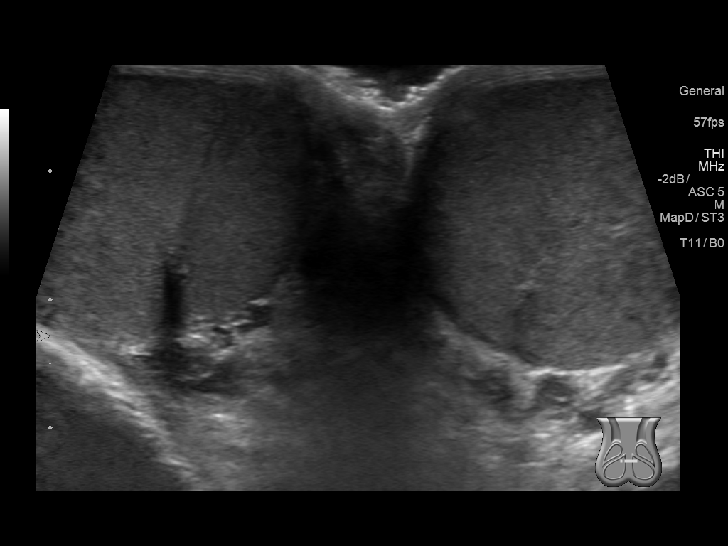
[im 6/69]
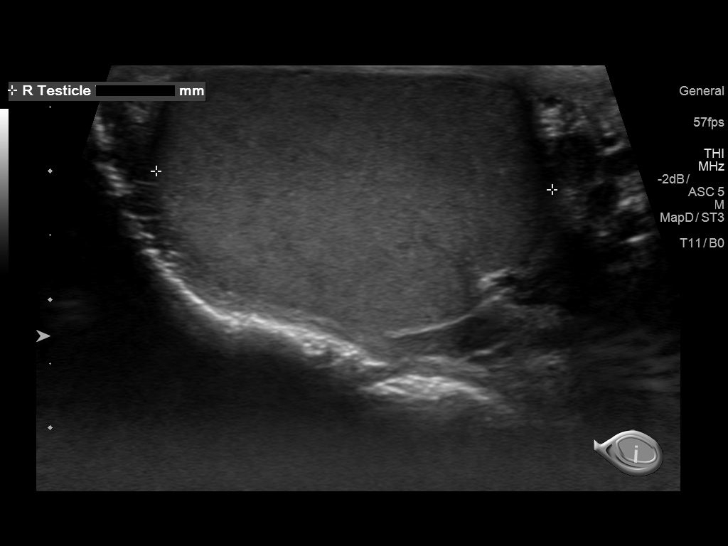
[im 12/69]
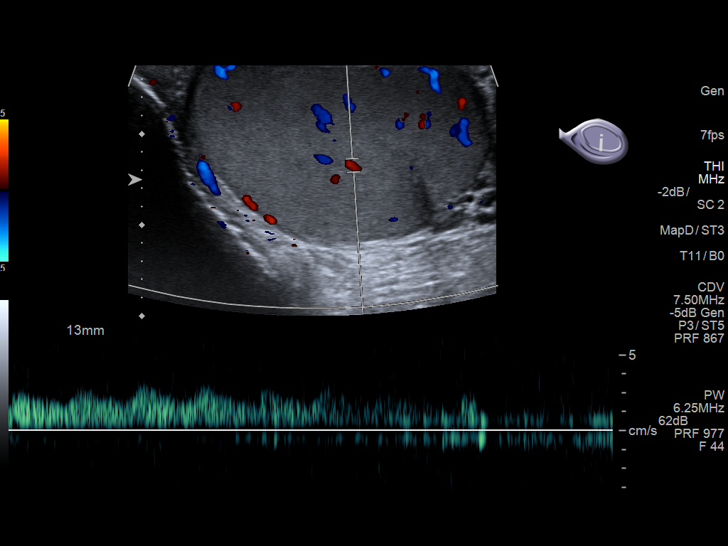
[im 18/69]
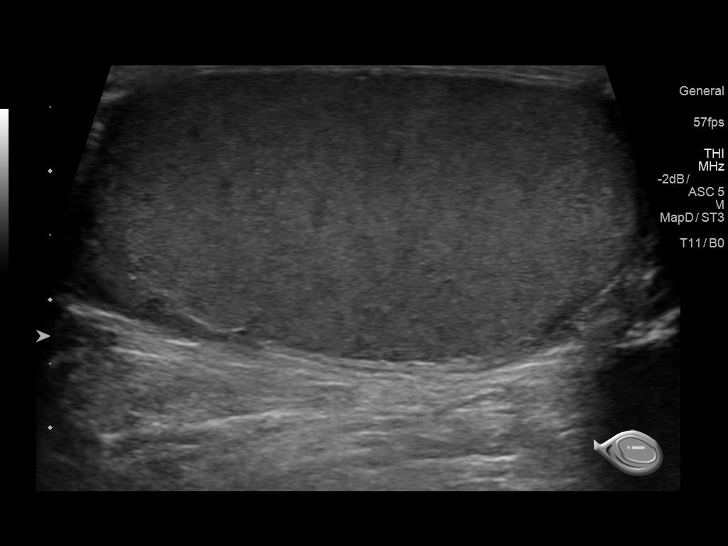
[im 23/69]
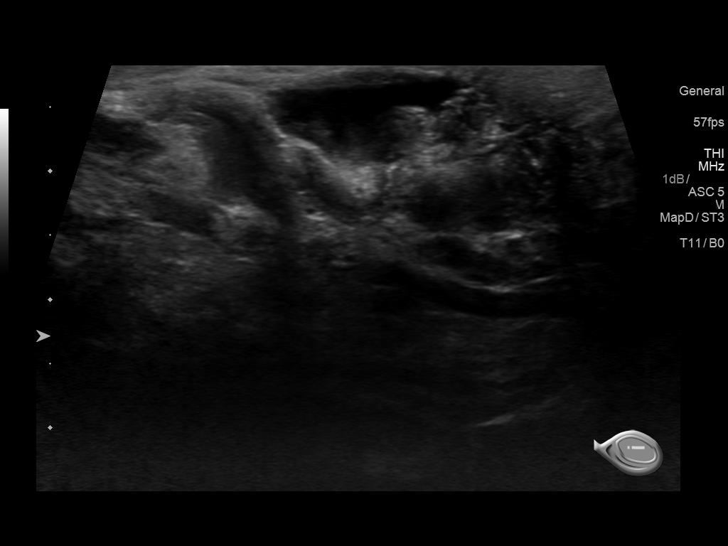
[im 26/69]
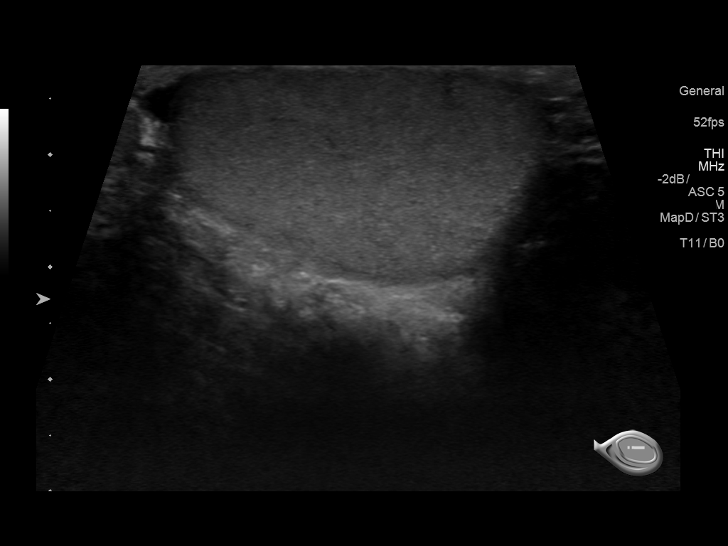
[im 32/69]
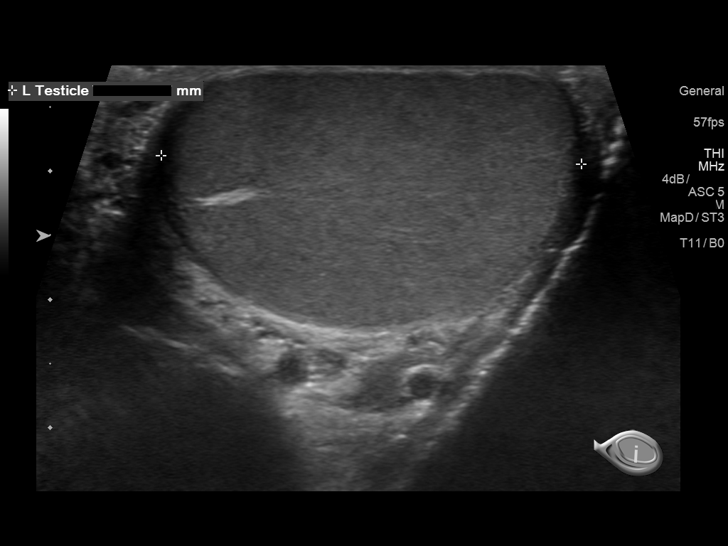
[im 37/69]
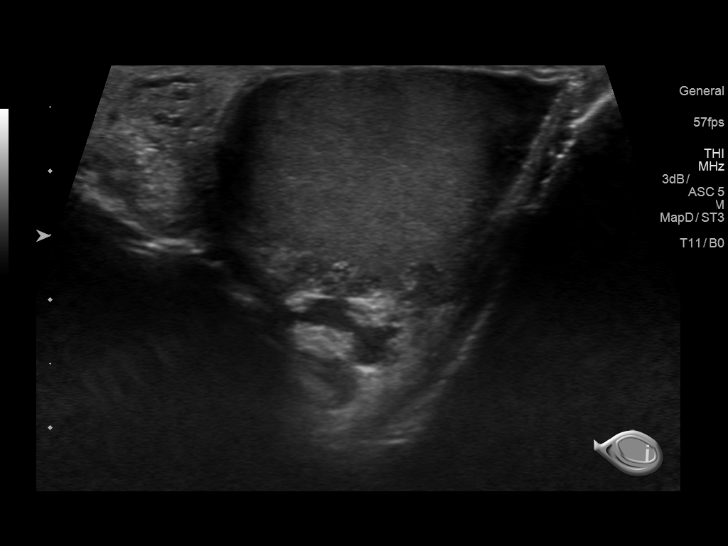
[im 43/69]
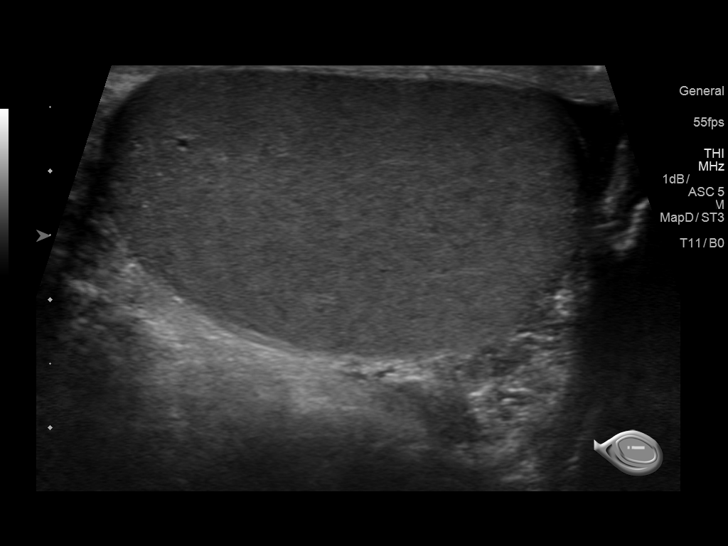
[im 46/69]
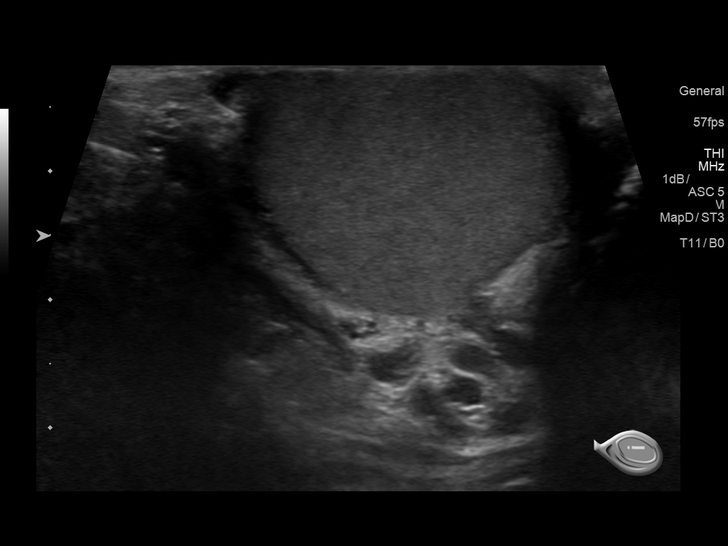
[im 52/69]
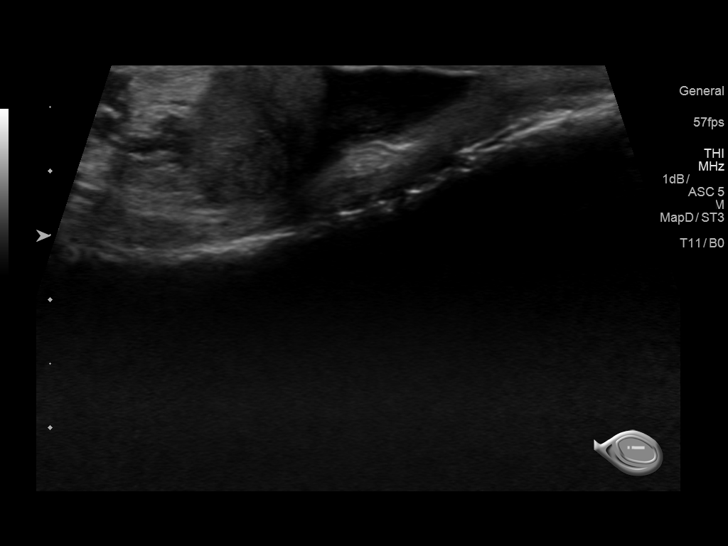
[im 57/69]
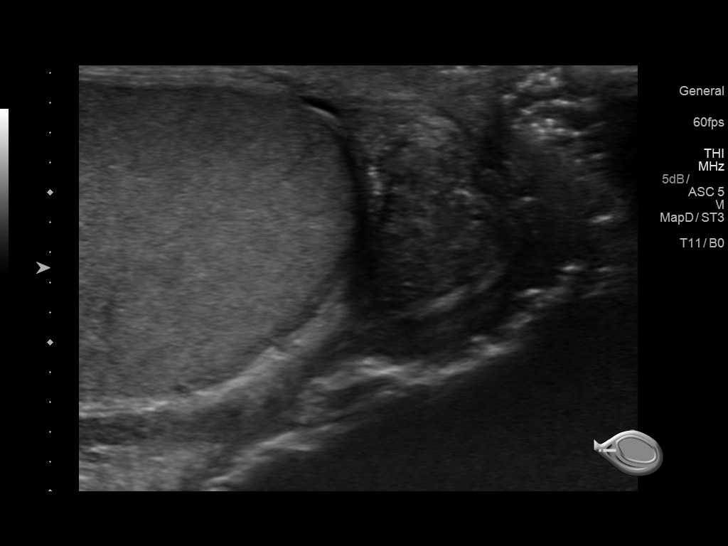
[im 63/69]
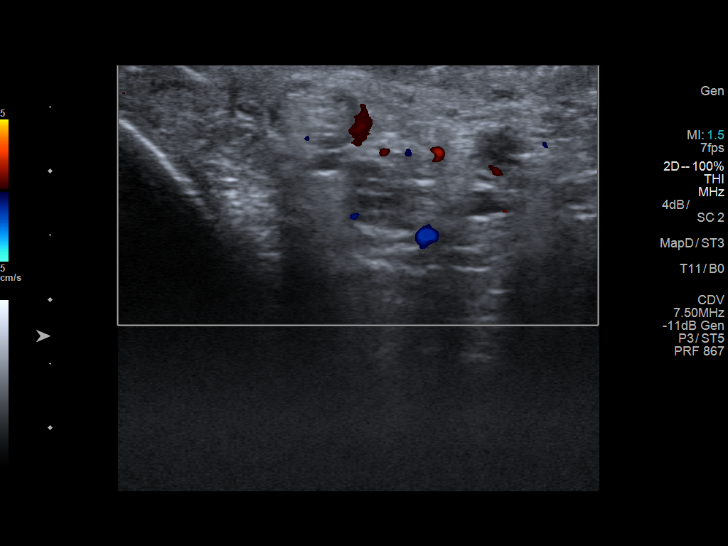
[im 69/69]
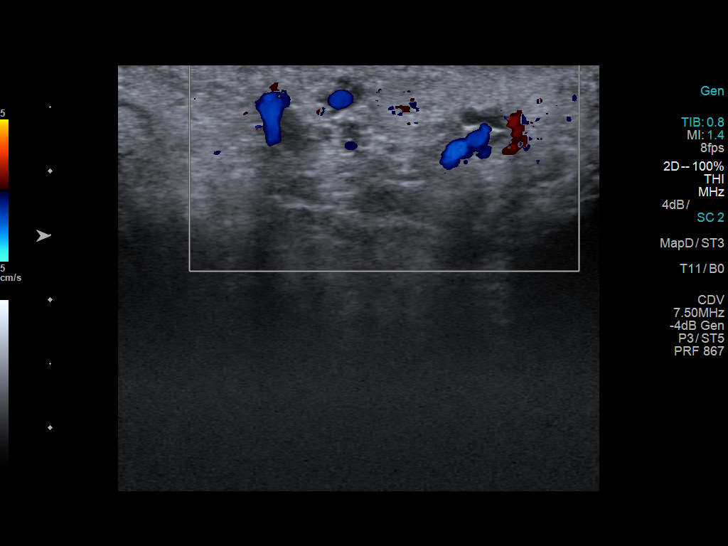

[14 of 25 positions shown; findings below may reference images not displayed]

FINDINGS: Right testicle

Measurements: 4.3 x 2.2 x 3.1 cm. No mass or microlithiasis
visualized.

Left testicle

Measurements: 3.8 x 2.2 x 3.3 cm. No mass or microlithiasis
visualized.

Right epididymis:  Slightly enlarged hyperemic epididymis.

Left epididymis:  Normal in size and appearance.

Hydrocele: Small bilateral hydroceles, particulate foci within the
left hydrocele.

Varicocele:  None visualized.

Pulsed Doppler interrogation of both testes demonstrates normal low
resistance arterial and venous waveforms bilaterally.
IMPRESSION: 1. Negative for testicular torsion
2. Slightly enlarged hyperemic right epididymis suggestive of
epididymitis
3. Small bilateral hydroceles
